# Patient Record
Sex: Female | Born: 1990 | Race: White | Hispanic: No | Marital: Single | Smoking: Never smoker
Health system: Southern US, Community
[De-identification: ages and names within clinical notes are randomized; demographics above are authoritative.]

## PROBLEM LIST (undated history)

## (undated) DIAGNOSIS — I951 Orthostatic hypotension: Secondary | ICD-10-CM

## (undated) DIAGNOSIS — R569 Unspecified convulsions: Secondary | ICD-10-CM

## (undated) DIAGNOSIS — G40802 Other epilepsy, not intractable, without status epilepticus: Secondary | ICD-10-CM

## (undated) DIAGNOSIS — R000 Tachycardia, unspecified: Principal | ICD-10-CM

## (undated) DIAGNOSIS — I498 Other specified cardiac arrhythmias: Secondary | ICD-10-CM

## (undated) DIAGNOSIS — G903 Multi-system degeneration of the autonomic nervous system: Secondary | ICD-10-CM

## (undated) DIAGNOSIS — G90A Postural orthostatic tachycardia syndrome (POTS): Secondary | ICD-10-CM

## (undated) HISTORY — DX: Multi-system degeneration of the autonomic nervous system: G90.3

## (undated) HISTORY — DX: Other epilepsy, not intractable, without status epilepticus: G40.802

## (undated) HISTORY — DX: Orthostatic hypotension: I95.1

## (undated) HISTORY — DX: Postural orthostatic tachycardia syndrome (POTS): G90.A

## (undated) HISTORY — DX: Other specified cardiac arrhythmias: I49.8

## (undated) HISTORY — DX: Unspecified convulsions: R56.9

## (undated) HISTORY — PX: NO PAST SURGERIES: SHX2092

## (undated) HISTORY — DX: Tachycardia, unspecified: R00.0

---

## 2012-10-21 DIAGNOSIS — G40309 Generalized idiopathic epilepsy and epileptic syndromes, not intractable, without status epilepticus: Secondary | ICD-10-CM | POA: Insufficient documentation

## 2012-10-21 DIAGNOSIS — R55 Syncope and collapse: Secondary | ICD-10-CM | POA: Insufficient documentation

## 2012-12-11 ENCOUNTER — Encounter: Payer: Self-pay | Admitting: Neurology

## 2012-12-11 DIAGNOSIS — G90A Postural orthostatic tachycardia syndrome (POTS): Secondary | ICD-10-CM | POA: Insufficient documentation

## 2012-12-11 DIAGNOSIS — F988 Other specified behavioral and emotional disorders with onset usually occurring in childhood and adolescence: Secondary | ICD-10-CM

## 2012-12-11 DIAGNOSIS — G40802 Other epilepsy, not intractable, without status epilepticus: Secondary | ICD-10-CM

## 2012-12-11 DIAGNOSIS — F909 Attention-deficit hyperactivity disorder, unspecified type: Secondary | ICD-10-CM

## 2012-12-11 HISTORY — DX: Other specified behavioral and emotional disorders with onset usually occurring in childhood and adolescence: F98.8

## 2013-04-27 ENCOUNTER — Emergency Department (HOSPITAL_BASED_OUTPATIENT_CLINIC_OR_DEPARTMENT_OTHER)
Admission: EM | Admit: 2013-04-27 | Discharge: 2013-04-27 | Disposition: A | Discharge status: Home / Self Care | Payer: Managed Care, Other (non HMO) | Attending: Emergency Medicine | Admitting: Emergency Medicine

## 2013-04-27 ENCOUNTER — Encounter (HOSPITAL_BASED_OUTPATIENT_CLINIC_OR_DEPARTMENT_OTHER): Payer: Self-pay | Admitting: *Deleted

## 2013-04-27 DIAGNOSIS — S0100XA Unspecified open wound of scalp, initial encounter: Secondary | ICD-10-CM | POA: Insufficient documentation

## 2013-04-27 DIAGNOSIS — W1809XA Striking against other object with subsequent fall, initial encounter: Secondary | ICD-10-CM | POA: Insufficient documentation

## 2013-04-27 DIAGNOSIS — Z8679 Personal history of other diseases of the circulatory system: Secondary | ICD-10-CM | POA: Insufficient documentation

## 2013-04-27 DIAGNOSIS — Y92009 Unspecified place in unspecified non-institutional (private) residence as the place of occurrence of the external cause: Secondary | ICD-10-CM | POA: Insufficient documentation

## 2013-04-27 DIAGNOSIS — Y93K1 Activity, walking an animal: Secondary | ICD-10-CM | POA: Insufficient documentation

## 2013-04-27 DIAGNOSIS — S0101XA Laceration without foreign body of scalp, initial encounter: Secondary | ICD-10-CM

## 2013-04-27 DIAGNOSIS — Z79899 Other long term (current) drug therapy: Secondary | ICD-10-CM | POA: Insufficient documentation

## 2013-04-27 DIAGNOSIS — G40909 Epilepsy, unspecified, not intractable, without status epilepticus: Secondary | ICD-10-CM | POA: Insufficient documentation

## 2013-04-27 NOTE — ED Provider Notes (Signed)
History     CSN: [id number]  Arrival date & time 04/27/13  1057   First MD Initiated Contact with Patient 04/27/13 1159      Chief Complaint  Patient presents with  . Head Laceration    (Consider location/radiation/quality/duration/timing/severity/associated sxs/prior treatment) Patient is a 22 y.o. female presenting with skin laceration. The history is provided by the patient. No language interpreter was used.  Laceration Location:  Head/neck Head/neck laceration location:  Scalp Length (cm):  2 Depth:  Through dermis Pain details:    Quality:  Aching   Severity:  Moderate Foreign body present:  No foreign bodies Worsened by:  Nothing tried Ineffective treatments:  None tried   Past Medical History  Diagnosis Date  . Seizures   . Orthostatic hypotension dysautonomic syndrome     Past Surgical History  Procedure Laterality Date  . No past surgeries      Family History  Problem Relation Age of Onset  . Seizures Mother     History  Substance Use Topics  . Smoking status: Never Smoker   . Smokeless tobacco: Never Used  . Alcohol Use: No    OB History   Grav Para Term Preterm Abortions TAB SAB Ect Mult Living                  Review of Systems  Skin: Positive for wound.  All other systems reviewed and are negative.    Allergies  Dye fdc red and Other  Home Medications   Current Outpatient Rx  Name  Route  Sig  Dispense  Refill  . fludrocortisone (FLORINEF) 0.1 MG tablet   Oral   Take 0.1 mg by mouth as directed.         . lamoTRIgine (LAMICTAL) 100 MG tablet   Oral   Take 100 mg by mouth 3 (three) times daily.         . midodrine (PROAMATINE) 5 MG tablet   Oral   Take 5 mg by mouth 3 (three) times daily.           BP 114/75  Pulse 104  Temp(Src) 97.8 F (36.6 C) (Oral)  Resp 20  Ht 5\' 11"  (1.803 m)  Wt 135 lb (61.236 kg)  BMI 18.84 kg/m2  SpO2 100%  Physical Exam  Nursing note and vitals reviewed. Constitutional: She  appears well-developed and well-nourished.  HENT:  Head: Normocephalic.  Nose: Nose normal.  Mouth/Throat: Oropharynx is clear and moist.  Eyes: Pupils are equal, round, and reactive to light.  Neck: Normal range of motion.  Cardiovascular: Normal rate.   Pulmonary/Chest: Effort normal.  Musculoskeletal: Normal range of motion.  Neurological: She is alert.  Skin: Skin is warm.  Psychiatric: She has a normal mood and affect.    ED Course  LACERATION REPAIR Date/Time: 04/27/2013 12:45 PM Performed by: Elson Areas Authorized by: Elson Areas Consent: Verbal consent obtained. Risks and benefits: risks, benefits and alternatives were discussed Time out: Immediately prior to procedure a "time out" was called to verify the correct patient, procedure, equipment, support staff and site/side marked as required. Body area: head/neck Location details: scalp Laceration length: 2 cm Tendon involvement: none Nerve involvement: none Vascular damage: no Anesthesia: local infiltration Local anesthetic: lidocaine 2% without epinephrine Preparation: Patient was prepped and draped in the usual sterile fashion. Irrigation solution: saline Degree of undermining: none Skin closure: staples Number of sutures: 4 Technique: simple Approximation: close Approximation difficulty: simple Patient tolerance: Patient tolerated the procedure  well with no immediate complications.   (including critical care time)  Labs Reviewed - No data to display No results found.   No diagnosis found.    MDM          Elson Areas, PA-C 04/27/13 1247

## 2013-04-27 NOTE — ED Provider Notes (Signed)
Medical screening examination/treatment/procedure(s) were performed by non-physician practitioner and as supervising physician I was immediately available for consultation/collaboration.   Charles B. Sheldon, MD 04/27/13 1446 

## 2013-04-27 NOTE — ED Notes (Signed)
She was walking the dog, fell in her house and hit the left side of her head on a brick fire place. No LOC. Small scalp laceration and abrasion noted.

## 2013-05-15 ENCOUNTER — Other Ambulatory Visit: Payer: Self-pay

## 2013-05-15 DIAGNOSIS — G40309 Generalized idiopathic epilepsy and epileptic syndromes, not intractable, without status epilepticus: Secondary | ICD-10-CM

## 2013-05-15 MED ORDER — LAMOTRIGINE 100 MG PO TABS: 100.0000 mg | ORAL_TABLET | Freq: Three times a day (TID) | ORAL | Status: DC

## 2013-05-16 ENCOUNTER — Encounter (HOSPITAL_BASED_OUTPATIENT_CLINIC_OR_DEPARTMENT_OTHER): Payer: Self-pay | Admitting: *Deleted

## 2013-11-07 ENCOUNTER — Other Ambulatory Visit: Payer: Self-pay | Admitting: Family

## 2013-11-12 ENCOUNTER — Encounter: Payer: Self-pay | Admitting: Family

## 2013-11-14 ENCOUNTER — Ambulatory Visit: Payer: Self-pay | Admitting: Family

## 2014-01-31 ENCOUNTER — Ambulatory Visit: Payer: Self-pay | Admitting: Family

## 2014-02-06 ENCOUNTER — Ambulatory Visit: Payer: Self-pay | Admitting: Family

## 2014-02-19 ENCOUNTER — Other Ambulatory Visit: Payer: Self-pay | Admitting: Family

## 2014-02-22 ENCOUNTER — Ambulatory Visit (INDEPENDENT_AMBULATORY_CARE_PROVIDER_SITE_OTHER): Payer: Managed Care, Other (non HMO) | Admitting: Family

## 2014-02-22 ENCOUNTER — Encounter: Payer: Self-pay | Admitting: Family

## 2014-02-22 VITALS — BP 114/70 | HR 78 | Ht 70.0 in | Wt 147.8 lb

## 2014-02-22 DIAGNOSIS — I951 Orthostatic hypotension: Principal | ICD-10-CM

## 2014-02-22 DIAGNOSIS — G90A Postural orthostatic tachycardia syndrome (POTS): Secondary | ICD-10-CM

## 2014-02-22 DIAGNOSIS — G40802 Other epilepsy, not intractable, without status epilepticus: Secondary | ICD-10-CM

## 2014-02-22 DIAGNOSIS — G40309 Generalized idiopathic epilepsy and epileptic syndromes, not intractable, without status epilepticus: Secondary | ICD-10-CM

## 2014-02-22 DIAGNOSIS — I498 Other specified cardiac arrhythmias: Secondary | ICD-10-CM

## 2014-02-22 DIAGNOSIS — R000 Tachycardia, unspecified: Principal | ICD-10-CM

## 2014-02-22 NOTE — Patient Instructions (Signed)
Continue your Lamotrigine without change. Call me if you have any questions or concerns about your condition.  Please plan to return for follow up in 1 year or sooner if needed.

## 2014-02-22 NOTE — Progress Notes (Signed)
Patient: Jennifer Galloway MRN: [id number] Sex: female DOB: Feb 23, 1991  Provider: Elveria RisingGOODPASTURE, Johan Creveling, NP [facility] Care[facility]  Note type: Routine return visit  History of Present Illness: Referral Source: Dr. Rosiland OzScott Buck History from: patient Chief Complaint: POTS  Jennifer Gandylizabeth Huston Jennifer Galloway is a 23 y.o. 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 Florinef and Midodrine, along with adequate hydration has controlled the POTS symptoms fairly well. She had flare up with hypotension in December 2014 and was [facility] by Dr Ace GinsBuck for management of her condition.   Jennifer Galloway also has a photic sensitive seizure disorder. EEG has shown evidence of photo myoclonic or photo convulsive responses are generalized spike and slow wave activity associated with photic stimulation, but she has also had clinical seizures. The last occurred on November 02, 2009. Jennifer Galloway has been seizure free on Lamotrigine since that time. EEG June 03, 2006 showed polyspike and slow-wave discharges with photic stimulation. This was similar to EEG done August 09, 2002 which showed 3-1/2-4-1/2 Hz spike and slow wave complexes during photic stimulation. She drives without restrictions and is not interested in coming off medication, so repeat EEG's for the purpose of tapering medication have not been performed.  Review of Systems: 12 system review was unremarkable  Past Medical History  Diagnosis Date  . POTS (postural orthostatic tachycardia syndrome)   . Photosensitive reflex epilepsy   . ADHD (attention deficit hyperactivity disorder)   . Seizures   . Orthostatic hypotension dysautonomic syndrome    Hospitalizations: no, Head Injury: no, Nervous System Infections: no, Immunizations up to date: yes Past Medical History Comments: Jennifer Galloway has Postural Orthostatic Tachycardia  Syndrome. She has had photic sensitive  seizures and has an abnormal EEG that shows evidence of photic sensitivity. She had problems with hypotension in December 2014 and [facility]She had loop recorder implanted and medications adjusted.   Surgical History Past Surgical History  Procedure Laterality Date  . No past surgeries      Family History family history includes Cancer in her paternal grandfather; Seizures in her mother; Thyroid disease in her mother. Family History is otherwise negative for migraines, seizures, cognitive impairment, blindness, deafness, birth defects, chromosomal disorder, autism.  Social History History   Social History  . Marital Status: Single    Spouse Name: N/A    Number of Children: N/A  . Years of Education: N/A   Social History Main Topics  . Smoking status: Never Smoker   . Smokeless tobacco: Never Used  . Alcohol Use: No  . Drug Use: No  . Sexual Activity: No   Other Topics Concern  . None   Social History Narrative   ** Merged History Encounter **       Educational level: junior college School Attending: [employer] Living with:  parents and sister [place], while she and her sister are in college in [place].  Hobbies/Interest: Enjoys horseback riding School comments:  Jennifer Galloway is attending [employer] in pursuit of an associate degree in the arts and plans to transfer this fall to [employer] to get a Bachelor's degree in Business Management. She graduated from [employer] in 2011.  Physical Exam BP 114/70  Pulse 78  Ht 5\' 10"  (1.778 m)  Wt 147 lb 12.8 oz (67.042 kg)  BMI 21.21 kg/m2  LMP 02/20/2014 General: well developed, well nourished young woman, seated in exam room, in no evident distress Head:  head normocephalic and atraumatic.  Oropharynx benign. Neck: supple with no carotid or supraclavicular bruits Cardiovascular: regular rate and rhythm, no murmurs Skin: No rashes or lesions  Neurologic Exam Mental Status: Awake and fully  alert.  Oriented to place and time.  Recent and remote memory intact.  Attention span, concentration, and fund of knowledge appropriate.  Mood and affect appropriate. Cranial Nerves: Fundoscopic exam revels sharp disc margins.  Pupils equal, briskly reactive to light.  Extraocular movements full without nystagmus.  Visual fields full to confrontation.  Hearing intact and symmetric to finger rub.  Facial sensation intact.  Face tongue, palate move normally and symmetrically.  Neck flexion and extension normal. Motor: Normal bulk and tone. Normal strength in all tested extremity muscles. Sensory: Intact to touch and temperature in all extremities.  Coordination: Rapid alternating movements normal in all extremities.  Finger-to-nose and heel-to shin performed accurately bilaterally.  Romberg negative. Gait and Station: Arises from chair without difficulty.  Stance is normal. Gait demonstrates normal stride length and balance.   Able to heel, toe and tandem walk without difficulty. Reflexes: diminished and symmetric. Toes downgoing.  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 She has been seizure free on Lamotrigine since December, 2010. Jennifer Galloway drives without restrictions and is not interested in tapering off medication. I told Jennifer Galloway that I will continue to see her in this practice while she is in college and then will help her to transition to adult neurology care when she graduates from college, as [place] to [place] closer to family. She will continue her medications without change for now and will return for follow up in 1 year or sooner if needed.

## 2014-02-25 ENCOUNTER — Other Ambulatory Visit: Payer: Self-pay | Admitting: Family

## 2014-04-01 ENCOUNTER — Other Ambulatory Visit: Payer: Self-pay | Admitting: Family

## 2014-07-30 DIAGNOSIS — N926 Irregular menstruation, unspecified: Secondary | ICD-10-CM | POA: Insufficient documentation

## 2014-12-02 ENCOUNTER — Encounter (HOSPITAL_COMMUNITY): Payer: Self-pay | Admitting: Emergency Medicine

## 2014-12-02 ENCOUNTER — Emergency Department (HOSPITAL_COMMUNITY)
Admission: EM | Admit: 2014-12-02 | Discharge: 2014-12-02 | Disposition: A | Discharge status: Home / Self Care | Payer: Managed Care, Other (non HMO) | Attending: Emergency Medicine | Admitting: Emergency Medicine

## 2014-12-02 ENCOUNTER — Emergency Department (HOSPITAL_COMMUNITY): Payer: Managed Care, Other (non HMO)

## 2014-12-02 DIAGNOSIS — Z79899 Other long term (current) drug therapy: Secondary | ICD-10-CM | POA: Diagnosis not present

## 2014-12-02 DIAGNOSIS — Y9389 Activity, other specified: Secondary | ICD-10-CM | POA: Insufficient documentation

## 2014-12-02 DIAGNOSIS — R000 Tachycardia, unspecified: Secondary | ICD-10-CM | POA: Insufficient documentation

## 2014-12-02 DIAGNOSIS — Y998 Other external cause status: Secondary | ICD-10-CM | POA: Insufficient documentation

## 2014-12-02 DIAGNOSIS — Z3202 Encounter for pregnancy test, result negative: Secondary | ICD-10-CM | POA: Diagnosis not present

## 2014-12-02 DIAGNOSIS — R55 Syncope and collapse: Secondary | ICD-10-CM

## 2014-12-02 DIAGNOSIS — I951 Orthostatic hypotension: Secondary | ICD-10-CM | POA: Diagnosis not present

## 2014-12-02 DIAGNOSIS — Y9289 Other specified places as the place of occurrence of the external cause: Secondary | ICD-10-CM | POA: Diagnosis not present

## 2014-12-02 DIAGNOSIS — W08XXXA Fall from other furniture, initial encounter: Secondary | ICD-10-CM | POA: Insufficient documentation

## 2014-12-02 DIAGNOSIS — E870 Hyperosmolality and hypernatremia: Secondary | ICD-10-CM | POA: Diagnosis not present

## 2014-12-02 DIAGNOSIS — S0990XA Unspecified injury of head, initial encounter: Secondary | ICD-10-CM | POA: Diagnosis present

## 2014-12-02 DIAGNOSIS — G90A Postural orthostatic tachycardia syndrome (POTS): Secondary | ICD-10-CM

## 2014-12-02 DIAGNOSIS — S0003XA Contusion of scalp, initial encounter: Secondary | ICD-10-CM

## 2014-12-02 DIAGNOSIS — G40909 Epilepsy, unspecified, not intractable, without status epilepticus: Secondary | ICD-10-CM | POA: Insufficient documentation

## 2014-12-02 DIAGNOSIS — Z7952 Long term (current) use of systemic steroids: Secondary | ICD-10-CM | POA: Insufficient documentation

## 2014-12-02 LAB — CBC WITH DIFFERENTIAL/PLATELET
Basophils Absolute: 0 10*3/uL (ref 0.0–0.1)
Basophils Relative: 0 % (ref 0–1)
EOS ABS: 0 10*3/uL (ref 0.0–0.7)
EOS PCT: 0 % (ref 0–5)
HCT: 40.5 % (ref 36.0–46.0)
Hemoglobin: 13 g/dL (ref 12.0–15.0)
LYMPHS ABS: 1.6 10*3/uL (ref 0.7–4.0)
Lymphocytes Relative: 20 % (ref 12–46)
MCH: 26.8 pg (ref 26.0–34.0)
MCHC: 32.1 g/dL (ref 30.0–36.0)
MCV: 83.5 fL (ref 78.0–100.0)
Monocytes Absolute: 0.8 10*3/uL (ref 0.1–1.0)
Monocytes Relative: 10 % (ref 3–12)
NEUTROS PCT: 70 % (ref 43–77)
Neutro Abs: 5.6 10*3/uL (ref 1.7–7.7)
PLATELETS: 299 10*3/uL (ref 150–400)
RBC: 4.85 MIL/uL (ref 3.87–5.11)
RDW: 14.4 % (ref 11.5–15.5)
WBC: 8.1 10*3/uL (ref 4.0–10.5)

## 2014-12-02 LAB — BASIC METABOLIC PANEL
Anion gap: 9 (ref 5–15)
BUN: 13 mg/dL (ref 6–23)
CALCIUM: 9.7 mg/dL (ref 8.4–10.5)
CO2: 26 mmol/L (ref 19–32)
Chloride: 112 mmol/L (ref 96–112)
Creatinine, Ser: 0.97 mg/dL (ref 0.50–1.10)
GFR calc Af Amer: >90 mL/min (ref >90)
GFR calc non Af Amer: 82 mL/min — ABNORMAL LOW (ref >90)
Glucose, Bld: 103 mg/dL — ABNORMAL HIGH (ref 70–99)
Potassium: 4 mmol/L (ref 3.5–5.1)
Sodium: 147 mmol/L — ABNORMAL HIGH (ref 135–145)

## 2014-12-02 LAB — URINALYSIS, ROUTINE W REFLEX MICROSCOPIC
Bilirubin Urine: NEGATIVE
Glucose, UA: NEGATIVE mg/dL
Hgb urine dipstick: NEGATIVE
Ketones, ur: NEGATIVE mg/dL
Nitrite: NEGATIVE
PROTEIN: NEGATIVE mg/dL
Specific Gravity, Urine: 1.017 (ref 1.005–1.030)
UROBILINOGEN UA: 0.2 mg/dL (ref 0.0–1.0)
pH: 7 (ref 5.0–8.0)

## 2014-12-02 LAB — URINE MICROSCOPIC-ADD ON

## 2014-12-02 LAB — POC URINE PREG, ED: Preg Test, Ur: NEGATIVE

## 2014-12-02 MED ORDER — SODIUM CHLORIDE 0.9 % IV BOLUS (SEPSIS)
1000.0000 mL | Freq: Once | INTRAVENOUS | Status: AC
  Administered 2014-12-02: 1000 mL via INTRAVENOUS

## 2014-12-02 NOTE — ED Notes (Signed)
Brought in by EMS from [facility] with c/o seizures.  Pt was sitting in her class when she had "seizure episode", passed out and fell, hitting head.  It was reported that she "was out for a minute".  Pt presents to ED A/Ox4, fully awake.  Pt sustained a large hematoma on the back of her head, with complaint of headache--- no other complaints.

## 2014-12-02 NOTE — ED Notes (Signed)
Bed: WA11 Expected date:  Expected time:  Means of arrival:  Comments: EMS-seizure 

## 2014-12-02 NOTE — ED Provider Notes (Signed)
CSN: [id number]     Arrival date & time 12/02/14  1649 History   First MD Initiated Contact with Patient 12/02/14 1728     Chief Complaint  Patient presents with  . Seizures  . Head Injury     (Consider location/radiation/quality/duration/timing/severity/associated sxs/prior Treatment) HPI Comments: Jennifer Galloway is a 25 y.o. female with a PMHx of POTS, photosensitive reflex epilepsy, orthostatic hypotension dysautonomic syndrome, and ADHD, who presents to the ED with complaints of syncopal episode that occurred while she was sitting down on a stool during class, causing her to fall back from seated height onto a hard floor and hit her head. She states that syncopal episode lasted less than 5 minutes, and she awoke once the EMS had arrived. States she doesn't have a seizure disorder, only POTS, and denies that this was a seizure. She states she has an implanted cardiac monitor but states it doesn't defibrillate, only warns her of tachycardia. Baseline HR is 120s. Has had syncopal episodes similar to this since 4th grade. Takes lamictal but states she doesn't have seizures. Denies any blood thinning medications. Currently she's complaining of scalp pain and associated hematoma left parietal area, stating that the pain is 6/10 nonradiating throbbing constant pain with no aggravating factors, and alleviated with ice. She denies any prodromal aura/symptoms, fevers, chills, chest pain, shortness of breath, abdominal pain, nausea, vomiting, diarrhea, cause patient, dysuria, hematuria, vaginal bleeding or discharge, tinnitus, vision changes, headache, back or neck pain, dizziness, numbness, weakness, or tingling. She denies any loss of bowel or bladder function, and denies any post ictal confusion. Last menstrual was 10/15/14, states that she has irregular menses and started Depo-Provera, next injection due on February 23.  Patient is a 24 y.o. female presenting with head injury and syncope. The  history is provided by the patient. No language interpreter was used.  Head Injury Location:  L parietal Time since incident:  45 minutes Mechanism of injury: fall   Pain details:    Quality:  Throbbing   Severity:  Moderate (6/10)   Duration:  45 minutes   Timing:  Constant   Progression:  Unchanged Chronicity:  New Relieved by:  Ice Worsened by:  Nothing tried Ineffective treatments:  None tried Associated symptoms: loss of consciousness   Associated symptoms: no blurred vision, no difficulty breathing, no disorientation, no double vision, no focal weakness, no headaches, no hearing loss, no memory loss, no nausea, no neck pain, no numbness, no seizures, no tinnitus and no vomiting   Loss of Consciousness Episode history:  Single Most recent episode:  Today Duration: "a few minutes, <5 minutes" Timing:  Intermittent Progression:  Resolved Chronicity:  Chronic Context: normal activity   Witnessed: yes   Relieved by:  None tried Worsened by:  Nothing tried Ineffective treatments:  None tried Associated symptoms: no anxiety, no chest pain, no confusion, no diaphoresis, no difficulty breathing, no dizziness, no fever, no focal sensory loss, no focal weakness, no headaches, no malaise/fatigue, no nausea, no palpitations, no recent fall, no recent injury, no recent surgery, no rectal bleeding, no seizures, no shortness of breath, no visual change, no vomiting and no weakness   Risk factors: no seizures     Past Medical History  Diagnosis Date  . POTS (postural orthostatic tachycardia syndrome)   . Photosensitive reflex epilepsy   . ADHD (attention deficit hyperactivity disorder)   . Seizures   . Orthostatic hypotension dysautonomic syndrome    Past Surgical History  Procedure Laterality Date  .  No past surgeries     Family History  Problem Relation Age of Onset  . Thyroid disease Mother   . Seizures Mother   . Cancer Paternal Grandfather     Died at 5078   History   Substance Use Topics  . Smoking status: Never Smoker   . Smokeless tobacco: Never Used  . Alcohol Use: No   OB History    No data available     Review of Systems  Constitutional: Negative for fever, chills, malaise/fatigue and diaphoresis.  HENT: Negative for hearing loss and tinnitus.        +scalp hematoma/pain  Eyes: Negative for blurred vision, double vision, photophobia and visual disturbance.  Respiratory: Negative for shortness of breath.   Cardiovascular: Positive for syncope. Negative for chest pain and palpitations.  Gastrointestinal: Negative for nausea, vomiting, diarrhea, constipation and blood in stool.  Genitourinary: Negative for dysuria, frequency, hematuria, vaginal bleeding, vaginal discharge and menstrual problem.  Musculoskeletal: Negative for myalgias, back pain, arthralgias and neck pain.  Skin: Negative for wound.  Neurological: Positive for loss of consciousness and syncope. Negative for dizziness, tremors, focal weakness, seizures, facial asymmetry, weakness, light-headedness, numbness and headaches.  Psychiatric/Behavioral: Negative for memory loss and confusion.   10 Systems reviewed and are negative for acute change except as noted in the HPI.    Allergies  Dye fdc red and Other  Home Medications   Prior to Admission medications   Medication Sig Start Date End Date Taking? Authorizing Provider  fludrocortisone (FLORINEF) 0.1 MG tablet Take 0.1 mg by mouth 2 (two) times daily.    Yes Historical Provider, MD  lamoTRIgine (LAMICTAL) 100 MG tablet Take 100 mg by mouth 2 (two) times daily.   Yes Historical Provider, MD  midodrine (PROAMATINE) 10 MG tablet Take 10 mg by mouth 3 (three) times daily.   Yes Historical Provider, MD  lamoTRIgine (LAMICTAL) 100 MG tablet TAKE 1 TABLET (100 MG TOTAL) BY MOUTH 3 (THREE) TIMES DAILY. Patient not taking: Reported on 12/02/2014    Elveria Risingina Goodpasture, NP  lamoTRIgine (LAMICTAL) 100 MG tablet TAKE 1 TABLET (100 MG  TOTAL) BY MOUTH 3 (THREE) TIMES DAILY. Patient not taking: Reported on 12/02/2014    Elveria Risingina Goodpasture, NP   BP 126/84 mmHg  Pulse 107  Temp(Src) 98.2 F (36.8 C) (Oral)  Resp 20  SpO2 100% Physical Exam  Constitutional: She is oriented to person, place, and time. She appears well-developed and well-nourished.  Non-toxic appearance. No distress.  Afebrile, nontoxic, NAD, with mild tachycardia which pt states is her baseline. Otherwise VSS.  HENT:  Head: Normocephalic. Head is with contusion. Head is without raccoon's eyes, without Battle's sign and without laceration.  Mouth/Throat: Oropharynx is clear and moist. Mucous membranes are dry.  Hematoma to L posterior scalp which is TTP without bony crepitus or step offs, no lacerations, neg raccoon's eyes or battle's sign.  Mildly dry mucous membranes  Eyes: Conjunctivae and EOM are normal. Pupils are equal, round, and reactive to light. Right eye exhibits no discharge. Left eye exhibits no discharge.  PERRL, EOMI without nystagmus  Neck: Normal range of motion. Neck supple. No spinous process tenderness and no muscular tenderness present. No rigidity. Normal range of motion present.  FROM intact without spinous process or paraspinous muscle TTP, no bony stepoffs or deformities, no muscle spasms. No rigidity or meningeal signs. No bruising or swelling.   Cardiovascular: Regular rhythm, normal heart sounds and intact distal pulses.  Tachycardia present.  Exam reveals no  gallop and no friction rub.   No murmur heard. Mildly tachycardic in the low 100s-110s, which pt states is baseline. Reg rhythm, nl s1/s2, no m/r/g, distal pulses intact, no pedal edema  Pulmonary/Chest: Effort normal and breath sounds normal. No respiratory distress. She has no decreased breath sounds. She has no wheezes. She has no rhonchi. She has no rales.  Abdominal: Soft. Normal appearance and bowel sounds are normal. She exhibits no distension. There is no tenderness. There  is no rigidity, no rebound, no guarding and no CVA tenderness.  Musculoskeletal: Normal range of motion.  All spinal levels nonTTP without bony stepoffs or deformities.  MAE x4 Strength 5/5 in all extremities Sensation grossly intact in all extremities  Neurological: She is alert and oriented to person, place, and time. She has normal strength. No cranial nerve deficit or sensory deficit. Coordination normal. GCS eye subscore is 4. GCS verbal subscore is 5. GCS motor subscore is 6.  CN 2-12 grossly intact A&O x4 GCS 15 Sensation and strength intact Coordination with finger-to-nose WNL Neg pronator drift   Skin: Skin is warm, dry and intact. No rash noted.  No abrasions or lacerations L scalp hematoma as noted above  Psychiatric: She has a normal mood and affect.  Nursing note and vitals reviewed.   ED Course  Procedures (including critical care time) 19:32 Orthostatic Vital Signs JH  Orthostatic Lying  - BP- Lying: 121/82 mmHg ; Pulse- Lying: 90  Orthostatic Sitting - BP- Sitting: 139/79 mmHg ; Pulse- Sitting: 100  Orthostatic Standing at 0 minutes - BP- Standing at 0 minutes: 133/80 mmHg ; Pulse- Standing at 0 minutes: 101    Labs Review Labs Reviewed  BASIC METABOLIC PANEL - Abnormal; Notable for the following:    Sodium 147 (*)    Glucose, Bld 103 (*)    GFR calc non Af Amer 82 (*)    All other components within normal limits  URINALYSIS, ROUTINE W REFLEX MICROSCOPIC - Abnormal; Notable for the following:    Leukocytes, UA TRACE (*)    All other components within normal limits  CBC WITH DIFFERENTIAL/PLATELET  URINE MICROSCOPIC-ADD ON  LAMOTRIGINE LEVEL  POC URINE PREG, ED    Imaging Review Ct Head Wo Contrast  12/02/2014   CLINICAL DATA:  24 year old female with syncopal episode today complicated by a fall with injury to the back of the head. Swelling in the occipital region. Abrasion to the scan.  EXAM: CT HEAD WITHOUT CONTRAST  CT CERVICAL SPINE WITHOUT CONTRAST   TECHNIQUE: Multidetector CT imaging of the head and cervical spine was performed following the standard protocol without intravenous contrast. Multiplanar CT image reconstructions of the cervical spine were also generated.  COMPARISON:  Head CT 11/01/2008.  FINDINGS: CT HEAD FINDINGS  Extensive soft tissue swelling in the left parieto-occipital scalp, compatible with a contusion and small hematoma. No acute displaced skull fractures are identified. No acute intracranial abnormality. Specifically, no evidence of acute post-traumatic intracranial hemorrhage, no definite regions of acute/subacute cerebral ischemia, no focal mass, mass effect, hydrocephalus or abnormal intra or extra-axial fluid collections. The visualized paranasal sinuses and mastoids are well pneumatized.  CT CERVICAL SPINE FINDINGS  Reversal of normal cervical lordosis centered at the level of C6, presumably positional. Alignment is otherwise anatomic. No acute displaced fractures are noted. Prevertebral soft tissues are normal. Mild multilevel degenerative disc disease, most severe at C4-C5. Visualized portions of the upper thorax are unremarkable.  IMPRESSION: 1. Soft tissue contusion and small scalp hematoma in  the left parieto-occipital region. No underlying displaced skull fracture identified. 2. No signs of significant acute intracranial trauma. The appearance of the brain is normal. 3. No evidence of acute traumatic injury to the cervical spine.   Electronically Signed   By: Trudie Reed M.D.   On: 12/02/2014 18:54   Ct Cervical Spine Wo Contrast  12/02/2014   CLINICAL DATA:  24 year old female with syncopal episode today complicated by a fall with injury to the back of the head. Swelling in the occipital region. Abrasion to the scan.  EXAM: CT HEAD WITHOUT CONTRAST  CT CERVICAL SPINE WITHOUT CONTRAST  TECHNIQUE: Multidetector CT imaging of the head and cervical spine was performed following the standard protocol without intravenous  contrast. Multiplanar CT image reconstructions of the cervical spine were also generated.  COMPARISON:  Head CT 11/01/2008.  FINDINGS: CT HEAD FINDINGS  Extensive soft tissue swelling in the left parieto-occipital scalp, compatible with a contusion and small hematoma. No acute displaced skull fractures are identified. No acute intracranial abnormality. Specifically, no evidence of acute post-traumatic intracranial hemorrhage, no definite regions of acute/subacute cerebral ischemia, no focal mass, mass effect, hydrocephalus or abnormal intra or extra-axial fluid collections. The visualized paranasal sinuses and mastoids are well pneumatized.  CT CERVICAL SPINE FINDINGS  Reversal of normal cervical lordosis centered at the level of C6, presumably positional. Alignment is otherwise anatomic. No acute displaced fractures are noted. Prevertebral soft tissues are normal. Mild multilevel degenerative disc disease, most severe at C4-C5. Visualized portions of the upper thorax are unremarkable.  IMPRESSION: 1. Soft tissue contusion and small scalp hematoma in the left parieto-occipital region. No underlying displaced skull fracture identified. 2. No signs of significant acute intracranial trauma. The appearance of the brain is normal. 3. No evidence of acute traumatic injury to the cervical spine.   Electronically Signed   By: Trudie Reed M.D.   On: 12/02/2014 18:54     EKG Interpretation   Date/Time:  Monday December 02 2014 18:40:41 EST Ventricular Rate:  103 PR Interval:  138 QRS Duration: 88 QT Interval:  338 QTC Calculation: 442 R Axis:   92 Text Interpretation:  Sinus tachycardia Probable left atrial enlargement  Consider right ventricular hypertrophy No old tracing to compare Confirmed  by [facility]  MD, MARTHA [phone]) on 12/02/2014 6:48:41 PM      MDM   Final diagnoses:  Head injury  POTS (postural orthostatic tachycardia syndrome)  Syncope and collapse  Scalp hematoma, initial encounter   Essential hypernatremia    24 y.o. female with hx of syncope/POTS since 4th grade, had syncopal episode today. Denies that it was a seizure, and states she doesn't have a seizure d/o but takes lamictal. +Head inj with +LOC and significant hematoma, no scalp crepitus. Will obtain imaging, basic labs, EKG, and give fluids. Pt is tachycardic which she states is persistent. Has an implanted monitor but denies that it's a defibrillator. Nonfocal neuro exam. Pt denies wanting pain medications at this time. Will reassess shortly.   8:29 PM Upreg neg. U/A with trace leuks but no UTI. CBC w/diff WNL. BMP pending. Lamotrigine level pending. Orthostatic VS showing some slight increase in HR with standing c/w prior hx of POTS. CT head/neck showing soft tissue contusion without underlying scalp fracture or intracranial trauma. Pt's HR improved with fluids, now 94bpm. Will reassess shortly.  9:50 PM BMP resulting, delay with lab machines, shows sodium of 147 but pt was given fluids and doubt need for further intervention. Pt still without  headache or any other neuro changes. Will have her see her PCP in 3 days for recheck. Discussed mental rest for concussive symptoms, and gradual return to activity as tolerated after HA subsides (in the event she has a HA). Discussed tylenol/motrin for pain. I explained the diagnosis and have given explicit precautions to return to the ER including for any other new or worsening symptoms. The patient understands and accepts the medical plan as it's been dictated and I have answered their questions. Discharge instructions concerning home care and prescriptions have been given. The patient is STABLE and is discharged to home in good condition.  BP 126/72 mmHg  Pulse 94  Temp(Src) 98.2 F (36.8 C) (Oral)  Resp 18  SpO2 98%  LMP 10/15/2014  Meds ordered this encounter  Medications  . sodium chloride 0.9 % bolus 1,000 mL    Sig:      Jennifer [place],  PA-C 12/02/14 2154  Tilden Fossa, MD 12/02/14 2330

## 2014-12-02 NOTE — Progress Notes (Signed)
EDCM spoke to patient at bedside.  Patient reports her pcp is Dr. Riley NearingAguiar.  Patient also reports her cardiologist is Dr. Rosiland OzScott Buck.  System updated.  No further EDCM needs at this time.

## 2014-12-02 NOTE — ED Notes (Signed)
Patient transported to CT 

## 2014-12-02 NOTE — ED Notes (Signed)
Patient stated she did not have a seizuire. Said she has POTS syndrome.

## 2014-12-02 NOTE — Discharge Instructions (Signed)
Use Ibuprofen or Tylenol for pain. Get plenty of rest, use ice on your head.  Stay in a quiet, not simulating, dark environment. No TV, computer use, video games until headache is resolved completely. No contact sports until cleared by your regular doctor. Follow Up with primary care physician in 3-4 days if headache persists.  Return to the emergency department if patient becomes lethargic, begins vomiting or other change in mental status. Continue your normal home medications.    Head Injury You have a head injury. Headaches and throwing up (vomiting) are common after a head injury. It should be easy to wake up from sleeping. Sometimes you must stay in the hospital. Most problems happen within the first 24 hours. Side effects may occur up to 7-10 days after the injury.  WHAT ARE THE TYPES OF HEAD INJURIES? Head injuries can be as minor as a bump. Some head injuries can be more severe. More severe head injuries include:  A jarring injury to the brain (concussion).  A bruise of the brain (contusion). This mean there is bleeding in the brain that can cause swelling.  A cracked skull (skull fracture).  Bleeding in the brain that collects, clots, and forms a bump (hematoma). WHEN SHOULD I GET HELP RIGHT AWAY?   You are confused or sleepy.  You cannot be woken up.  You feel sick to your stomach (nauseous) or keep throwing up (vomiting).  Your dizziness or unsteadiness is getting worse.  You have very bad, lasting headaches that are not helped by medicine. Take medicines only as told by your doctor.  You cannot use your arms or legs like normal.  You cannot walk.  You notice changes in the black spots in the center of the colored part of your eye (pupil).  You have clear or bloody fluid coming from your nose or ears.  You have trouble seeing. During the next 24 hours after the injury, you must stay with someone who can watch you. This person should get help right away (call 911 in the  [place] if you start to shake and are not able to control it (have seizures), you pass out, or you are unable to wake up. HOW CAN I PREVENT A HEAD INJURY IN THE FUTURE?  Wear seat belts.  Wear a helmet while bike riding and playing sports like football.  Stay away from dangerous activities around the house. WHEN CAN I RETURN TO NORMAL ACTIVITIES AND ATHLETICS? See your doctor before doing these activities. You should not do normal activities or play contact sports until 1 week after the following symptoms have stopped:  Headache that does not go away.  Dizziness.  Poor attention.  Confusion.  Memory problems.  Sickness to your stomach or throwing up.  Tiredness.  Fussiness.  Bothered by bright lights or loud noises.  Anxiousness or depression.  Restless sleep. MAKE SURE YOU:   Understand these instructions.  Will watch your condition.  Will get help right away if you are not doing well or get worse. Document Released: 10/07/2008 Document Revised: 03/11/2014 Document Reviewed: 07/02/2013 [facility] Patient Information 2015 [place], [place]. This information is not intended to replace advice given to you by your health care provider. Make sure you discuss any questions you have with your health care provider.  Facial or Scalp Contusion A facial or scalp contusion is a deep bruise on the face or head. Injuries to the face and head generally cause a lot of swelling, especially around the eyes. Contusions are  the result of an injury that caused bleeding under the skin. The contusion may turn blue, purple, or yellow. Minor injuries will give you a painless contusion, but more severe contusions may stay painful and swollen for a few weeks.  CAUSES  A facial or scalp contusion is caused by a blunt injury or trauma to the face or head area.  SIGNS AND SYMPTOMS   Swelling of the injured area.   Discoloration of the injured area.   Tenderness, soreness, or pain in the injured  area.  DIAGNOSIS  The diagnosis can be made by taking a medical history and doing a physical exam. An X-ray exam, CT scan, or MRI may be needed to determine if there are any associated injuries, such as broken bones (fractures). TREATMENT  Often, the best treatment for a facial or scalp contusion is applying cold compresses to the injured area. Over-the-counter medicines may also be recommended for pain control.  HOME CARE INSTRUCTIONS   Only take over-the-counter or prescription medicines as directed by your health care provider.   Apply ice to the injured area.   Put ice in a plastic bag.   Place a towel between your skin and the bag.   Leave the ice on for 20 minutes, 2-3 times a day.  SEEK MEDICAL CARE IF:  You have bite problems.   You have pain with chewing.   You are concerned about facial defects. SEEK IMMEDIATE MEDICAL CARE IF:  You have severe pain or a headache that is not relieved by medicine.   You have unusual sleepiness, confusion, or personality changes.   You throw up (vomit).   You have a persistent nosebleed.   You have double vision or blurred vision.   You have fluid drainage from your nose or ear.   You have difficulty walking or using your arms or legs.  MAKE SURE YOU:   Understand these instructions.  Will watch your condition.  Will get help right away if you are not doing well or get worse. Document Released: 12/02/2004 Document Revised: 08/15/2013 Document Reviewed: 06/07/2013 [facility] Patient Information 2015 [place], [place]. This information is not intended to replace advice given to you by your health care provider. Make sure you discuss any questions you have with your health care provider.  Cryotherapy Cryotherapy means treatment with cold. Ice or gel packs can be used to reduce both pain and swelling. Ice is the most helpful within the first 24 to 48 hours after an injury or flare-up from overusing a muscle or joint.  Sprains, strains, spasms, burning pain, shooting pain, and aches can all be eased with ice. Ice can also be used when recovering from surgery. Ice is effective, has very few side effects, and is safe for most people to use. PRECAUTIONS  Ice is not a safe treatment option for people with:  Raynaud phenomenon. This is a condition affecting small blood vessels in the extremities. Exposure to cold may cause your problems to return.  Cold hypersensitivity. There are many forms of cold hypersensitivity, including:  Cold urticaria. Red, itchy hives appear on the skin when the tissues begin to warm after being iced.  Cold erythema. This is a red, itchy rash caused by exposure to cold.  Cold hemoglobinuria. Red blood cells break down when the tissues begin to warm after being iced. The hemoglobin that carry oxygen are passed into the urine because they cannot combine with blood proteins fast enough.  Numbness or altered sensitivity in the area being  iced. If you have any of the following conditions, do not use ice until you have discussed cryotherapy with your caregiver:  Heart conditions, such as arrhythmia, angina, or chronic heart disease.  High blood pressure.  Healing wounds or open skin in the area being iced.  Current infections.  Rheumatoid arthritis.  Poor circulation.  Diabetes. Ice slows the blood flow in the region it is applied. This is beneficial when trying to stop inflamed tissues from spreading irritating chemicals to surrounding tissues. However, if you expose your skin to cold temperatures for too long or without the proper protection, you can damage your skin or nerves. Watch for signs of skin damage due to cold. HOME CARE INSTRUCTIONS Follow these tips to use ice and cold packs safely.  Place a dry or damp towel between the ice and skin. A damp towel will cool the skin more quickly, so you may need to shorten the time that the ice is used.  For a more rapid response,  add gentle compression to the ice.  Ice for no more than 10 to 20 minutes at a time. The bonier the area you are icing, the less time it will take to get the benefits of ice.  Check your skin after 5 minutes to make sure there are no signs of a poor response to cold or skin damage.  Rest 20 minutes or more between uses.  Once your skin is numb, you can end your treatment. You can test numbness by very lightly touching your skin. The touch should be so light that you do not see the skin dimple from the pressure of your fingertip. When using ice, most people will feel these normal sensations in this order: cold, burning, aching, and numbness.  Do not use ice on someone who cannot communicate their responses to pain, such as small children or people with dementia. HOW TO MAKE AN ICE PACK Ice packs are the most common way to use ice therapy. Other methods include ice massage, ice baths, and cryosprays. Muscle creams that cause a cold, tingly feeling do not offer the same benefits that ice offers and should not be used as a substitute unless recommended by your caregiver. To make an ice pack, do one of the following:  Place crushed ice or a bag of frozen vegetables in a sealable plastic bag. Squeeze out the excess air. Place this bag inside another plastic bag. Slide the bag into a pillowcase or place a damp towel between your skin and the bag.  Mix 3 parts water with 1 part rubbing alcohol. Freeze the mixture in a sealable plastic bag. When you remove the mixture from the freezer, it will be slushy. Squeeze out the excess air. Place this bag inside another plastic bag. Slide the bag into a pillowcase or place a damp towel between your skin and the bag. SEEK MEDICAL CARE IF:  You develop white spots on your skin. This may give the skin a blotchy (mottled) appearance.  Your skin turns blue or pale.  Your skin becomes waxy or hard.  Your swelling gets worse. MAKE SURE YOU:   Understand these  instructions.  Will watch your condition.  Will get help right away if you are not doing well or get worse. Document Released: 06/21/2011 Document Revised: 03/11/2014 Document Reviewed: 06/21/2011 [facility] Patient Information 2015 [place], [place]. This information is not intended to replace advice given to you by your health care provider. Make sure you discuss any questions you have with  your health care provider.  Concussion A concussion, or closed-head injury, is a brain injury caused by a direct blow to the head or by a quick and sudden movement (jolt) of the head or neck. Concussions are usually not life-threatening. Even so, the effects of a concussion can be serious. If you have had a concussion before, you are more likely to experience concussion-like symptoms after a direct blow to the head.  CAUSES  Direct blow to the head, such as from running into another player during a soccer game, being hit in a fight, or hitting your head on a hard surface.  A jolt of the head or neck that causes the brain to move back and forth inside the skull, such as in a car crash. SIGNS AND SYMPTOMS The signs of a concussion can be hard to notice. Early on, they may be missed by you, family members, and health care providers. You may look fine but act or feel differently. Symptoms are usually temporary, but they may last for days, weeks, or even longer. Some symptoms may appear right away while others may not show up for hours or days. Every head injury is different. Symptoms include:  Mild to moderate headaches that will not go away.  A feeling of pressure inside your head.  Having more trouble than usual:  Learning or remembering things you have heard.  Answering questions.  Paying attention or concentrating.  Organizing daily tasks.  Making decisions and solving problems.  Slowness in thinking, acting or reacting, speaking, or reading.  Getting lost or being easily confused.  Feeling  tired all the time or lacking energy (fatigued).  Feeling drowsy.  Sleep disturbances.  Sleeping more than usual.  Sleeping less than usual.  Trouble falling asleep.  Trouble sleeping (insomnia).  Loss of balance or feeling lightheaded or dizzy.  Nausea or vomiting.  Numbness or tingling.  Increased sensitivity to:  Sounds.  Lights.  Distractions.  Vision problems or eyes that tire easily.  Diminished sense of taste or smell.  Ringing in the ears.  Mood changes such as feeling sad or anxious.  Becoming easily irritated or angry for little or no reason.  Lack of motivation.  Seeing or hearing things other people do not see or hear (hallucinations). DIAGNOSIS Your health care provider can usually diagnose a concussion based on a description of your injury and symptoms. He or she will ask whether you passed out (lost consciousness) and whether you are having trouble remembering events that happened right before and during your injury. Your evaluation might include:  A brain scan to look for signs of injury to the brain. Even if the test shows no injury, you may still have a concussion.  Blood tests to be sure other problems are not present. TREATMENT  Concussions are usually treated in an emergency department, in urgent care, or at a clinic. You may need to stay in the hospital overnight for further treatment.  Tell your health care provider if you are taking any medicines, including prescription medicines, over-the-counter medicines, and natural remedies. Some medicines, such as blood thinners (anticoagulants) and aspirin, may increase the chance of complications. Also tell your health care provider whether you have had alcohol or are taking illegal drugs. This information may affect treatment.  Your health care provider will send you home with important instructions to follow.  How fast you will recover from a concussion depends on many factors. These factors  include how severe your concussion is, what part  of your brain was injured, your age, and how healthy you were before the concussion.  Most people with mild injuries recover fully. Recovery can take time. In general, recovery is slower in older persons. Also, persons who have had a concussion in the past or have other medical problems may find that it takes longer to recover from their current injury. HOME CARE INSTRUCTIONS General Instructions  Carefully follow the directions your health care provider gave you.  Only take over-the-counter or prescription medicines for pain, discomfort, or fever as directed by your health care provider.  Take only those medicines that your health care provider has approved.  Do not drink alcohol until your health care provider says you are well enough to do so. Alcohol and certain other drugs may slow your recovery and can put you at risk of further injury.  If it is harder than usual to remember things, write them down.  If you are easily distracted, try to do one thing at a time. For example, do not try to watch TV while fixing dinner.  Talk with family members or close friends when making important decisions.  Keep all follow-up appointments. Repeated evaluation of your symptoms is recommended for your recovery.  Watch your symptoms and tell others to do the same. Complications sometimes occur after a concussion. Older adults with a brain injury may have a higher risk of serious complications, such as a blood clot on the brain.  Tell your teachers, school nurse, school counselor, coach, athletic trainer, or work Production designer, theatre/television/film about your injury, symptoms, and restrictions. Tell them about what you can or cannot do. They should watch for:  Increased problems with attention or concentration.  Increased difficulty remembering or learning new information.  Increased time needed to complete tasks or assignments.  Increased irritability or decreased ability to  cope with stress.  Increased symptoms.  Rest. Rest helps the brain to heal. Make sure you:  Get plenty of sleep at night. Avoid staying up late at night.  Keep the same bedtime hours on weekends and weekdays.  Rest during the day. Take daytime naps or rest breaks when you feel tired.  Limit activities that require a lot of thought or concentration. These include:  Doing homework or job-related work.  Watching TV.  Working on the computer.  Avoid any situation where there is potential for another head injury (football, hockey, soccer, basketball, martial arts, downhill snow sports and horseback riding). Your condition will get worse every time you experience a concussion. You should avoid these activities until you are evaluated by the appropriate follow-up health care providers. Returning To Your Regular Activities You will need to return to your normal activities slowly, not all at once. You must give your body and brain enough time for recovery.  Do not return to sports or other athletic activities until your health care provider tells you it is safe to do so.  Ask your health care provider when you can drive, ride a bicycle, or operate heavy machinery. Your ability to react may be slower after a brain injury. Never do these activities if you are dizzy.  Ask your health care provider about when you can return to work or school. Preventing Another Concussion It is very important to avoid another brain injury, especially before you have recovered. In rare cases, another injury can lead to permanent brain damage, brain swelling, or death. The risk of this is greatest during the first 7-10 days after a head injury. Avoid injuries  by:  Wearing a seat belt when riding in a car.  Drinking alcohol only in moderation.  Wearing a helmet when biking, skiing, skateboarding, skating, or doing similar activities.  Avoiding activities that could lead to a second concussion, such as contact  or recreational sports, until your health care provider says it is okay.  Taking safety measures in your home.  Remove clutter and tripping hazards from floors and stairways.  Use grab bars in bathrooms and handrails by stairs.  Place non-slip mats on floors and in bathtubs.  Improve lighting in dim areas. SEEK MEDICAL CARE IF:  You have increased problems paying attention or concentrating.  You have increased difficulty remembering or learning new information.  You need more time to complete tasks or assignments than before.  You have increased irritability or decreased ability to cope with stress.  You have more symptoms than before. Seek medical care if you have any of the following symptoms for more than 2 weeks after your injury:  Lasting (chronic) headaches.  Dizziness or balance problems.  Nausea.  Vision problems.  Increased sensitivity to noise or light.  Depression or mood swings.  Anxiety or irritability.  Memory problems.  Difficulty concentrating or paying attention.  Sleep problems.  Feeling tired all the time. SEEK IMMEDIATE MEDICAL CARE IF:  You have severe or worsening headaches. These may be a sign of a blood clot in the brain.  You have weakness (even if only in one hand, leg, or part of the face).  You have numbness.  You have decreased coordination.  You vomit repeatedly.  You have increased sleepiness.  One pupil is larger than the other.  You have convulsions.  You have slurred speech.  You have increased confusion. This may be a sign of a blood clot in the brain.  You have increased restlessness, agitation, or irritability.  You are unable to recognize people or places.  You have neck pain.  It is difficult to wake you up.  You have unusual behavior changes.  You lose consciousness. MAKE SURE YOU:  Understand these instructions.  Will watch your condition.  Will get help right away if you are not doing well or  get worse. Document Released: 01/15/2004 Document Revised: 10/30/2013 Document Reviewed: 05/17/2013 [facility] Patient Information 2015 [place], [place]. This information is not intended to replace advice given to you by your health care provider. Make sure you discuss any questions you have with your health care provider.

## 2014-12-04 LAB — LAMOTRIGINE LEVEL: Lamotrigine Lvl: 5.2 ug/mL (ref 4.0–18.0)

## 2014-12-07 ENCOUNTER — Other Ambulatory Visit: Payer: Self-pay | Admitting: Family

## 2014-12-10 ENCOUNTER — Other Ambulatory Visit: Payer: Self-pay | Admitting: Family

## 2014-12-11 ENCOUNTER — Observation Stay (HOSPITAL_COMMUNITY)
Admission: EM | Admit: 2014-12-11 | Discharge: 2014-12-12 | Disposition: A | Discharge status: Home / Self Care | Payer: Managed Care, Other (non HMO) | Attending: Internal Medicine | Admitting: Internal Medicine

## 2014-12-11 ENCOUNTER — Emergency Department (HOSPITAL_COMMUNITY): Payer: Managed Care, Other (non HMO)

## 2014-12-11 DIAGNOSIS — R569 Unspecified convulsions: Secondary | ICD-10-CM

## 2014-12-11 DIAGNOSIS — R55 Syncope and collapse: Principal | ICD-10-CM | POA: Diagnosis present

## 2014-12-11 DIAGNOSIS — I951 Orthostatic hypotension: Secondary | ICD-10-CM | POA: Insufficient documentation

## 2014-12-11 DIAGNOSIS — F909 Attention-deficit hyperactivity disorder, unspecified type: Secondary | ICD-10-CM | POA: Diagnosis not present

## 2014-12-11 DIAGNOSIS — G40802 Other epilepsy, not intractable, without status epilepticus: Secondary | ICD-10-CM | POA: Insufficient documentation

## 2014-12-11 DIAGNOSIS — R000 Tachycardia, unspecified: Secondary | ICD-10-CM

## 2014-12-11 DIAGNOSIS — Z79899 Other long term (current) drug therapy: Secondary | ICD-10-CM | POA: Insufficient documentation

## 2014-12-11 DIAGNOSIS — G90A Postural orthostatic tachycardia syndrome (POTS): Secondary | ICD-10-CM | POA: Diagnosis present

## 2014-12-11 LAB — BASIC METABOLIC PANEL
Anion gap: 9 (ref 5–15)
BUN: 9 mg/dL (ref 6–23)
CO2: 24 mmol/L (ref 19–32)
Calcium: 9.9 mg/dL (ref 8.4–10.5)
Chloride: 107 mmol/L (ref 96–112)
Creatinine, Ser: 0.92 mg/dL (ref 0.50–1.10)
GFR calc Af Amer: >90 mL/min (ref >90)
GFR, EST NON AFRICAN AMERICAN: 87 mL/min — AB (ref >90)
Glucose, Bld: 112 mg/dL — ABNORMAL HIGH (ref 70–99)
POTASSIUM: 3.9 mmol/L (ref 3.5–5.1)
SODIUM: 140 mmol/L (ref 135–145)

## 2014-12-11 LAB — URINALYSIS, ROUTINE W REFLEX MICROSCOPIC
Bilirubin Urine: NEGATIVE
Glucose, UA: NEGATIVE mg/dL
Hgb urine dipstick: NEGATIVE
KETONES UR: NEGATIVE mg/dL
LEUKOCYTES UA: NEGATIVE
NITRITE: NEGATIVE
Protein, ur: NEGATIVE mg/dL
SPECIFIC GRAVITY, URINE: 1.013 (ref 1.005–1.030)
Urobilinogen, UA: 0.2 mg/dL (ref 0.0–1.0)
pH: 7.5 (ref 5.0–8.0)

## 2014-12-11 LAB — RAPID URINE DRUG SCREEN, HOSP PERFORMED
Amphetamines: NOT DETECTED
BARBITURATES: NOT DETECTED
BENZODIAZEPINES: NOT DETECTED
Cocaine: NOT DETECTED
OPIATES: NOT DETECTED
Tetrahydrocannabinol: NOT DETECTED

## 2014-12-11 LAB — URINALYSIS W MICROSCOPIC (NOT AT ARMC)
BILIRUBIN URINE: NEGATIVE
Glucose, UA: NEGATIVE mg/dL
Hgb urine dipstick: NEGATIVE
KETONES UR: NEGATIVE mg/dL
Leukocytes, UA: NEGATIVE
NITRITE: NEGATIVE
Protein, ur: NEGATIVE mg/dL
Specific Gravity, Urine: 1.014 (ref 1.005–1.030)
UROBILINOGEN UA: 0.2 mg/dL (ref 0.0–1.0)
pH: 7.5 (ref 5.0–8.0)

## 2014-12-11 LAB — CBC WITH DIFFERENTIAL/PLATELET
BASOS PCT: 0 % (ref 0–1)
Basophils Absolute: 0 10*3/uL (ref 0.0–0.1)
Eosinophils Absolute: 0.1 10*3/uL (ref 0.0–0.7)
Eosinophils Relative: 1 % (ref 0–5)
HCT: 40.9 % (ref 36.0–46.0)
HEMOGLOBIN: 13.4 g/dL (ref 12.0–15.0)
LYMPHS ABS: 1.2 10*3/uL (ref 0.7–4.0)
LYMPHS PCT: 17 % (ref 12–46)
MCH: 26.6 pg (ref 26.0–34.0)
MCHC: 32.8 g/dL (ref 30.0–36.0)
MCV: 81.2 fL (ref 78.0–100.0)
Monocytes Absolute: 0.8 10*3/uL (ref 0.1–1.0)
Monocytes Relative: 12 % (ref 3–12)
NEUTROS PCT: 70 % (ref 43–77)
Neutro Abs: 4.9 10*3/uL (ref 1.7–7.7)
Platelets: 285 10*3/uL (ref 150–400)
RBC: 5.04 MIL/uL (ref 3.87–5.11)
RDW: 14.5 % (ref 11.5–15.5)
WBC: 7 10*3/uL (ref 4.0–10.5)

## 2014-12-11 LAB — POC URINE PREG, ED: Preg Test, Ur: NEGATIVE

## 2014-12-11 MED ORDER — ACETAMINOPHEN 325 MG PO TABS: 650.0000 mg | ORAL_TABLET | Freq: Four times a day (QID) | ORAL | Status: DC | PRN

## 2014-12-11 MED ORDER — METOPROLOL SUCCINATE ER 25 MG PO TB24
12.5000 mg | ORAL_TABLET | ORAL | Status: DC
  Administered 2014-12-12: 12.5 mg via ORAL
  Filled 2014-12-11 (×2): qty 1

## 2014-12-11 MED ORDER — FLUDROCORTISONE ACETATE 0.1 MG PO TABS
0.1000 mg | ORAL_TABLET | ORAL | Status: DC
  Administered 2014-12-12: 0.1 mg via ORAL
  Filled 2014-12-11 (×4): qty 1

## 2014-12-11 MED ORDER — SODIUM CHLORIDE 0.9 % IJ SOLN
3.0000 mL | Freq: Two times a day (BID) | INTRAMUSCULAR | Status: DC
  Administered 2014-12-11 – 2014-12-12 (×2): 3 mL via INTRAVENOUS

## 2014-12-11 MED ORDER — LAMOTRIGINE 100 MG PO TABS
100.0000 mg | ORAL_TABLET | ORAL | Status: DC
  Administered 2014-12-12: 100 mg via ORAL
  Filled 2014-12-11: qty 1

## 2014-12-11 MED ORDER — ONDANSETRON HCL 4 MG PO TABS
4.0000 mg | ORAL_TABLET | Freq: Four times a day (QID) | ORAL | Status: DC | PRN
Start: 2014-12-11 — End: 2014-12-12

## 2014-12-11 MED ORDER — SODIUM CHLORIDE 0.9 % IV SOLN
INTRAVENOUS | Status: DC
  Administered 2014-12-11: 23:00:00 via INTRAVENOUS

## 2014-12-11 MED ORDER — LAMOTRIGINE 100 MG PO TABS: 100.0000 mg | ORAL_TABLET | Freq: Two times a day (BID) | ORAL | Status: DC

## 2014-12-11 MED ORDER — METOPROLOL SUCCINATE ER 25 MG PO TB24: 12.5000 mg | ORAL_TABLET | Freq: Every day | ORAL | Status: DC

## 2014-12-11 MED ORDER — ONDANSETRON HCL 4 MG/2ML IJ SOLN: 4.0000 mg | Freq: Four times a day (QID) | INTRAMUSCULAR | Status: DC | PRN

## 2014-12-11 MED ORDER — ACETAMINOPHEN 650 MG RE SUPP: 650.0000 mg | Freq: Four times a day (QID) | RECTAL | Status: DC | PRN

## 2014-12-11 MED ORDER — MIDODRINE HCL 5 MG PO TABS
10.0000 mg | ORAL_TABLET | ORAL | Status: DC
  Administered 2014-12-12 (×3): 10 mg via ORAL
  Filled 2014-12-11 (×3): qty 2

## 2014-12-11 NOTE — ED Provider Notes (Signed)
CSN: [id number]     Arrival date & time 12/11/14  1713 History   First MD Initiated Contact with Patient 12/11/14 1718     Chief Complaint  Patient presents with  . Near Syncope     (Consider location/radiation/quality/duration/timing/severity/associated sxs/prior Treatment) HPI Comments:  Patient here after having syncopal episode prior to arrival. Patient  has a history of pots syndrome and the episode happened when she stood up. She was recently started on Cipro for UTI as well as placed on a beta blocker yesterday due to tachycardia. Mother observed with sounds like seizure activity with a postictal phase. She denies any headache at this time. Was seen at [facility] week ago after passing out and had a head CT which was negative at that time. Patient's hematoma on her occipital region has resolved. She denies any unilateral weakness. She feels back to her baseline at this time  Patient is a 24 y.o. female presenting with near-syncope. The history is provided by the patient.  Near Syncope    Past Medical History  Diagnosis Date  . POTS (postural orthostatic tachycardia syndrome)   . Photosensitive reflex epilepsy   . ADHD (attention deficit hyperactivity disorder)   . Seizures   . Orthostatic hypotension dysautonomic syndrome    Past Surgical History  Procedure Laterality Date  . No past surgeries     Family History  Problem Relation Age of Onset  . Thyroid disease Mother   . Seizures Mother   . Cancer Paternal Grandfather     Died at 24   History  Substance Use Topics  . Smoking status: Never Smoker   . Smokeless tobacco: Never Used  . Alcohol Use: No   OB History    No data available     Review of Systems  Cardiovascular: Positive for near-syncope.  All other systems reviewed and are negative.     Allergies  Dye fdc red and Other  Home Medications   Prior to Admission medications   Medication Sig Start Date End Date Taking? Authorizing Provider   fludrocortisone (FLORINEF) 0.1 MG tablet Take 0.1 mg by mouth 2 (two) times daily.     Historical Provider, MD  lamoTRIgine (LAMICTAL) 100 MG tablet TAKE 1 TABLET (100 MG TOTAL) BY MOUTH 3 (THREE) TIMES DAILY. Patient not taking: Reported on 12/02/2014    Elveria Rising, NP  lamoTRIgine (LAMICTAL) 100 MG tablet Take 100 mg by mouth 2 (two) times daily.    Historical Provider, MD  lamoTRIgine (LAMICTAL) 100 MG tablet TAKE 1 TABLET (100 MG TOTAL) BY MOUTH 3 (THREE) TIMES DAILY. 12/09/14   Elveria Rising, NP  midodrine (PROAMATINE) 10 MG tablet Take 10 mg by mouth 3 (three) times daily.    Historical Provider, MD   BP 129/88 mmHg  Pulse 98  Temp(Src) 98.3 F (36.8 C) (Oral)  Resp 13  SpO2 99%  LMP 10/15/2014 Physical Exam  Constitutional: She is oriented to person, place, and time. She appears well-developed and well-nourished.  Non-toxic appearance. No distress.  HENT:  Head: Normocephalic and atraumatic.  Eyes: Conjunctivae, EOM and lids are normal. Pupils are equal, round, and reactive to light.  Neck: Normal range of motion. Neck supple. No tracheal deviation present. No thyroid mass present.  Cardiovascular: Normal rate, regular rhythm and normal heart sounds.  Exam reveals no gallop.   No murmur heard. Pulmonary/Chest: Effort normal and breath sounds normal. No stridor. No respiratory distress. She has no decreased breath sounds. She has no wheezes. She  has no rhonchi. She has no rales.  Abdominal: Soft. Normal appearance and bowel sounds are normal. She exhibits no distension. There is no tenderness. There is no rebound and no CVA tenderness.  Musculoskeletal: Normal range of motion. She exhibits no edema or tenderness.  Neurological: She is alert and oriented to person, place, and time. She has normal strength. No cranial nerve deficit or sensory deficit. GCS eye subscore is 4. GCS verbal subscore is 5. GCS motor subscore is 6.  Skin: Skin is warm and dry. No abrasion and no rash  noted.  Psychiatric: She has a normal mood and affect. Her speech is normal and behavior is normal.  Nursing note and vitals reviewed.   ED Course  Procedures (including critical care time) Labs Review Labs Reviewed  CBC WITH DIFFERENTIAL/PLATELET  BASIC METABOLIC PANEL  URINALYSIS, ROUTINE W REFLEX MICROSCOPIC  POC URINE PREG, ED    Imaging Review No results found.   EKG Interpretation   Date/Time:  Wednesday December 11 2014 17:23:33 EST Ventricular Rate:  101 PR Interval:  142 QRS Duration: 87 QT Interval:  343 QTC Calculation: 445 R Axis:   86 Text Interpretation:  Sinus tachycardia RSR' in V1 or V2, right VCD or RVH  No significant change since last tracing Confirmed by Darria Corvera  MD, Sabatino Williard  ([id number]) on 12/11/2014 5:35:42 PM      MDM   Final diagnoses:  Seizure      Patient seen by neurology and will be admitted for evaluation of possible seizure  Toy BakerAnthony T Maanav Kassabian, MD 12/11/14 2031

## 2014-12-11 NOTE — H&P (Signed)
[facility] History and Physical  Patient: Jennifer Galloway  MRN: [id number]  DOB: 21-Oct-1991  DOS: the patient was seen and examined on 12/11/2014 PCP: Angelica Chessman., MD  Chief Complaint: Passing out episode  HPI: Jennifer Galloway is a 24 y.o. female with Past medical history of POT syndrome, photosensitive reflex epilepsy, ADHD. The patient is presenting with complaints of a syncopal episode. Patient is following up with cardiology at [facility] for her POT syndrome. Patient recently had a loop recorder which found that patient was having increased events of tachycardia. Due to that the patient was placed on low-dose beta blocker metoprolol 1 12/09/2014. Patient had complaints of fatigue and tiredness and went to see her primary care physician. Mother mentions that patient did not have any UTI symptoms but the note mentions that the patient has complained of increased urinary frequency and an order for last 1 week. A UA was performed which showed presence of leukocytes esterase but otherwise was unremarkable. The patient was placed on Cipro on 2-16. Patient took her Cipro for the first time on 12/11/2014 in the morning after taking within 30 minutes she had a syncopal episode. As per mother this felt like one of her routine syncopal episode as the patient was able to turn around and communicate without any confusion or prolonged immobilization or passing out. Patient had another episode at 4:30 PM while she was sitting on the couch and lean on left side and passed out. This time the patient was more confused and had a little bit of incompetence of her speech which lasted for a few minutes. By the time EMS arrived the patient was back to her baseline. At the time of my evaluation patient's mother mentions patient is asked her baseline and has not had any further events. Patient mother as well as patient denies any urinary symptoms nor there was any fever, chills, cough, chest  pain, shortness of breath, vomiting, diarrhea, burning urination, loss of control of bowel or bladder, focal deficit. Patient has been compliant with all her medications.  The patient is coming from home. And at her baseline independent for most of her ADL.  Review of Systems: as mentioned in the history of present illness.  A Comprehensive review of the other systems is negative.  Past Medical History  Diagnosis Date  . POTS (postural orthostatic tachycardia syndrome)   . Photosensitive reflex epilepsy   . ADHD (attention deficit hyperactivity disorder)   . Seizures   . Orthostatic hypotension dysautonomic syndrome    Past Surgical History  Procedure Laterality Date  . No past surgeries     Social History:  reports that she has never smoked. She has never used smokeless tobacco. She reports that she does not drink alcohol or use illicit drugs.  Allergies  Allergen Reactions  . Dye Fdc Red [Red Dye] Other (See Comments)    Allergy to Lamictal dye  . Other Other (See Comments)    Allergy to artificial sweetners    Family History  Problem Relation Age of Onset  . Thyroid disease Mother   . Seizures Mother   . Cancer Paternal Grandfather     Died at 56    Prior to Admission medications   Medication Sig Start Date End Date Taking? Authorizing Provider  fludrocortisone (FLORINEF) 0.1 MG tablet Take 0.1 mg by mouth 2 (two) times daily. 4:30 and 10 PM   Yes Historical Provider, MD  lamoTRIgine (LAMICTAL) 100 MG tablet Take 100 mg by mouth 2 (  two) times daily. 4:30 AND 10 PM   Yes Historical Provider, MD  metoprolol succinate (TOPROL-XL) 25 MG 24 hr tablet Take 12.5 mg by mouth daily.   Yes Historical Provider, MD  midodrine (PROAMATINE) 5 MG tablet Take 10 mg by mouth 3 (three) times daily with meals. 4:30, 11, 4   Yes Historical Provider, MD  lamoTRIgine (LAMICTAL) 100 MG tablet TAKE 1 TABLET (100 MG TOTAL) BY MOUTH 3 (THREE) TIMES DAILY. Patient not taking: Reported on 12/11/2014     Elveria Risingina Goodpasture, NP  lamoTRIgine (LAMICTAL) 100 MG tablet TAKE 1 TABLET (100 MG TOTAL) BY MOUTH 3 (THREE) TIMES DAILY. Patient not taking: Reported on 12/11/2014 12/09/14   Elveria Risingina Goodpasture, NP    Physical Exam: Filed Vitals:   12/11/14 2000 12/11/14 2100 12/11/14 2200 12/11/14 2227  BP: 124/85 119/78 112/70 127/74  Pulse: 86 85 87 78  Temp:    98.6 F (37 C)  TempSrc:    Oral  Resp: 12 16 11 12   Height:    5\' 10"  (1.778 m)  Weight:    65.772 kg (145 lb)  SpO2: 99% 97% 96% 98%    General: Alert, Awake and Oriented to Time, Place and Person. Appear in mild distress Eyes: PERRL ENT: Oral Mucosa clear moist. Neck: no JVD Cardiovascular: S1 and S2 Present, no Murmur, Peripheral Pulses Present Respiratory: Bilateral Air entry equal and Decreased, Clear to Auscultation, noCrackles, no wheezes Abdomen: Bowel Sound present, Soft and non tender Skin: no Rash Extremities: no Pedal edema, no calf tenderness Neurologic: Grossly no focal neuro deficit.  Labs on Admission:  CBC:  Recent Labs Lab 12/11/14 1722  WBC 7.0  NEUTROABS 4.9  HGB 13.4  HCT 40.9  MCV 81.2  PLT 285    CMP     Component Value Date/Time   NA 140 12/11/2014 1722   K 3.9 12/11/2014 1722   CL 107 12/11/2014 1722   CO2 24 12/11/2014 1722   GLUCOSE 112* 12/11/2014 1722   BUN 9 12/11/2014 1722   CREATININE 0.92 12/11/2014 1722   CALCIUM 9.9 12/11/2014 1722   GFRNONAA 87* 12/11/2014 1722   GFRAA >90 12/11/2014 1722    No results for input(s): LIPASE, AMYLASE in the last 168 hours.  No results for input(s): CKTOTAL, CKMB, CKMBINDEX, TROPONINI in the last 168 hours. BNP (last 3 results) No results for input(s): BNP in the last 8760 hours.  ProBNP (last 3 results) No results for input(s): PROBNP in the last 8760 hours.   Radiological Exams on Admission: Ct Head Wo Contrast  12/11/2014   CLINICAL DATA:  24 year old female with postural orthostatic tachycardic syndrome with history of recent fall and  injury to the head. Syncope.  EXAM: CT HEAD WITHOUT CONTRAST  TECHNIQUE: Contiguous axial images were obtained from the base of the skull through the vertex without intravenous contrast.  COMPARISON:  Head CT 12/02/2014.  FINDINGS: No acute intracranial abnormalities. Specifically, no evidence of acute intracranial hemorrhage, no definite findings of acute/subacute cerebral ischemia, no mass, mass effect, hydrocephalus or abnormal intra or extra-axial fluid collections. Visualized paranasal sinuses and mastoids are well pneumatized. No acute displaced skull fractures are identified.  IMPRESSION: 1. No acute intracranial abnormalities. 2. The appearance of the brain is normal. 3. Previously noted left parietal scalp hematoma has resolved.   Electronically Signed   By: Trudie Reedaniel  Entrikin M.D.   On: 12/11/2014 19:50   EKG: Independently reviewed. normal sinus rhythm, nonspecific ST and T waves changes.  Assessment/Plan Principal Problem:  Syncope Active Problems:   POTS (postural orthostatic tachycardia syndrome)   ADHD (attention deficit hyperactivity disorder)   Seizure   1. Syncope The patient is presenting with complaints of passing out episode. She had 2 episodes in one day one of which was her typical syncopal episode but then the other one was associated with prolonged incompetence as well as confusion. Patient was initially seen by neurology recommended further seizure workup but since patient mother was not comfortable to get the workup as an outpatient patient was recommended to be admitted to complete the workup. At the time of my evaluation patient does not appear to have any ongoing symptoms. CT scan is negative EKG is also not showing any acute abnormality. Workup showing electrolytes as well as WBCs as well as urinalysis does not show any evidence of infection. UDS is also clear. Patient will be admitted in telemetry for observation. We'll get orthostatic evaluation in the  morning. MRI brain as well as EEG in the morning. Continuing home medications. At present I do not thing that the patient has a UTI and therefore we will be holding off on adding any medications.  2. POTS Continue metoprolol at home doses. As well as continue other home medications. Gentle IV hydration.  Advance goals of care discussion: Full code   Consults: Neurology  DVT Prophylaxis: mechanical compression device Nutrition: Regular diet  Family Communication: Mother was present at bedside, opportunity was given to ask question and all questions were answered satisfactorily at the time of interview. Disposition: Admitted to observation in telemetry unit.  Author: Lynden Oxford, MD [facility] Pager: [phone] 12/11/2014, 11:54 PM    If 7PM-7AM, please contact night-coverage [email or web address] Password [email or web address]

## 2014-12-11 NOTE — ED Notes (Signed)
Loop recorder interrogated by charge nurse

## 2014-12-11 NOTE — ED Notes (Signed)
Dr Allena KatzPatel out of room

## 2014-12-11 NOTE — ED Notes (Signed)
Dr. Isaias CowmanAllan said pt could eat and drink. Pt given soda and [place] sand witch.

## 2014-12-11 NOTE — Consult Note (Signed)
NEURO HOSPITALIST CONSULT NOTE    Reason for Consult: seizure versus syncope  HPI:                                                                                                                                          Jennifer Galloway is an 24 y.o. female with a past medical history significant for POTS, photosensitive reflex epilepsy, and ADHD, brought in for further evaluation of a transient episode of loss of consciousness. She has a loop recorder implanted. Mother is at the bedside and said that her daughter was siting in a chair working in her computer when suddenly became less responsive, fell to a pillow in the right side, and started having twitching movements around her eyes for a couple of minutes. When she regained consciousness, she was combative, making no sense when talking, and confused. No reported bladder or bowel incontinence, tongue biting, or abnormal movements of arms or legs. Patient is follow by cardiology [facility] who advised mother to come to the ED for neurological testing. She was recently started on Cipro for UTI as well as placed on a beta blocker yesterday due to tachycardia, and was seen at [facility] week ago after passing out and had a head CT which was negative. Denies HA, vertigo, double vision, focal weakness or numbness, slurred speech, language or vision impairment. I did review her CT brain which was unremarkable. Patient has been on lamotrigine 100 mg BID for several years. Mother said that prior seizure evaluation years ago was unrevealing.  Past Medical History  Diagnosis Date  . POTS (postural orthostatic tachycardia syndrome)   . Photosensitive reflex epilepsy   . ADHD (attention deficit hyperactivity disorder)   . Seizures   . Orthostatic hypotension dysautonomic syndrome     Past Surgical History  Procedure Laterality Date  . No past surgeries      Family History  Problem Relation Age of Onset   . Thyroid disease Mother   . Seizures Mother   . Cancer Paternal Grandfather     Died at 61    Family History: mother with seizures, but no brain tumors or brain aneurysms.  Social History:  reports that she has never smoked. She has never used smokeless tobacco. She reports that she does not drink alcohol or use illicit drugs.  Allergies  Allergen Reactions  . Dye Fdc Red [Red Dye] Other (See Comments)    Allergy to Lamictal dye  . Other Other (See Comments)    Allergy to artificial sweetners    MEDICATIONS:  I have reviewed the patient's current medications.   ROS:                                                                                                                                       History obtained from the patient, mother, and chart review  General ROS: negative for - chills, fatigue, fever, night sweats, weight gain or weight loss Psychological ROS: negative for - behavioral disorder, hallucinations, memory difficulties, mood swings or suicidal ideation Ophthalmic ROS: negative for - blurry vision, double vision, eye pain or loss of vision ENT ROS: negative for - epistaxis, nasal discharge, oral lesions, sore throat, tinnitus or vertigo Allergy and Immunology ROS: negative for - hives or itchy/watery eyes Hematological and Lymphatic ROS: negative for - bleeding problems, bruising or swollen lymph nodes Endocrine ROS: negative for - galactorrhea, hair pattern changes, polydipsia/polyuria or temperature intolerance Respiratory ROS: negative for - cough, hemoptysis, shortness of breath or wheezing Cardiovascular ROS: negative for - chest pain, dyspnea on exertion, edema or irregular heartbeat Gastrointestinal ROS: negative for - abdominal pain, diarrhea, hematemesis, nausea/vomiting or stool incontinence Genito-Urinary ROS: negative for - dysuria,  hematuria, incontinence or urinary frequency/urgency Musculoskeletal ROS: negative for - joint swelling or muscular weakness Neurological ROS: as noted in HPI Dermatological ROS: negative for rash and skin lesion changes  Physical exam: pleasant female in no apparent distress. Blood pressure 129/88, pulse 98, temperature 98.3 F (36.8 C), temperature source Oral, resp. rate 13, last menstrual period 10/15/2014, SpO2 99 %. Head: normocephalic. Neck: supple, no bruits, no JVD. Cardiac: no murmurs. Lungs: clear. Abdomen: soft, no tender, no mass. Extremities: no edema. Skin: no rash Neurologic Examination:                                                                                                      General: Mental Status: Alert, oriented, thought content appropriate.  Speech fluent without evidence of aphasia.  Able to follow 3 step commands without difficulty. Cranial Nerves: II: Discs flat bilaterally; Visual fields grossly normal, pupils equal, round, reactive to light and accommodation III,IV, VI: ptosis not present, extra-ocular motions intact bilaterally V,VII: smile symmetric, facial light touch sensation normal bilaterally VIII: hearing normal bilaterally IX,X: gag reflex present XI: bilateral shoulder shrug XII: midline tongue extension without atrophy or fasciculations  Motor: Right : Upper extremity   5/5    Left:     Upper extremity   5/5  Lower extremity   5/5  Lower extremity   5/5 Tone and bulk:normal tone throughout; no atrophy noted Sensory: Pinprick and light touch intact throughout, bilaterally Deep Tendon Reflexes:  Right: Upper Extremity   Left: Upper extremity   biceps (C-5 to C-6) 2/4   biceps (C-5 to C-6) 2/4 tricep (C7) 2/4    triceps (C7) 2/4 Brachioradialis (C6) 2/4  Brachioradialis (C6) 2/4  Lower Extremity Lower Extremity  quadriceps (L-2 to L-4) 2/4   quadriceps (L-2 to L-4) 2/4 Achilles (S1) 2/4   Achilles (S1) 2/4  Plantars: Right:  downgoing   Left: downgoing Cerebellar: normal finger-to-nose,  normal heel-to-shin test Gait:  No tested due to multiple leads  No results found for: CHOL  Results for orders placed or performed during the hospital encounter of 12/11/14 (from the past 48 hour(s))  CBC with Differential/Platelet     Status: None   Collection Time: 12/11/14  5:22 PM  Result Value Ref Range   WBC 7.0 4.0 - 10.5 K/uL   RBC 5.04 3.87 - 5.11 MIL/uL   Hemoglobin 13.4 12.0 - 15.0 g/dL   HCT 16.1 09.6 - 04.5 %   MCV 81.2 78.0 - 100.0 fL   MCH 26.6 26.0 - 34.0 pg   MCHC 32.8 30.0 - 36.0 g/dL   RDW 40.9 81.1 - 91.4 %   Platelets 285 150 - 400 K/uL   Neutrophils Relative % 70 43 - 77 %   Neutro Abs 4.9 1.7 - 7.7 K/uL   Lymphocytes Relative 17 12 - 46 %   Lymphs Abs 1.2 0.7 - 4.0 K/uL   Monocytes Relative 12 3 - 12 %   Monocytes Absolute 0.8 0.1 - 1.0 K/uL   Eosinophils Relative 1 0 - 5 %   Eosinophils Absolute 0.1 0.0 - 0.7 K/uL   Basophils Relative 0 0 - 1 %   Basophils Absolute 0.0 0.0 - 0.1 K/uL    No results found.  Assessment/Plan: 24 y/o with a history of recurrent syncope in the setting of POTS, brought in after sustaining an episode of LOC with unusually prolonged confusion and combativeness after regaining full consciousness. Neuro-exam is non focal and CT brain unremarkable for acute abnormality. Based on clinical grounds I can not determine whether she had a convulsive syncope or a seizure. Mother doesn't feel comfortable taking patient back home and getting further neuro testing as outpatient, and thus will suggest admission to the hospital and getting EEG and MRI brain in the morning. Will continue current dose lamotrigine. Will follow up.  Wyatt Portela, MD 12/11/2014, 6:16 PM  [facility].

## 2014-12-11 NOTE — ED Notes (Signed)
Dr. Patel in to see patient

## 2014-12-11 NOTE — ED Notes (Signed)
Lamotrigine 100mg  and Fludrocortisone 0.1mg  taken from patients own meds.  Dr. Allena KatzPatel aware

## 2014-12-11 NOTE — ED Notes (Addendum)
Pt has postural orthostatic tachycardic syndrome. She has had it since 4th grade. A week ago, pt. Fell and hit head. Has a hematoma. Had a syncope episode. Here b/c mom said, "pt. Acted differently; speech was off and out of it." happened at 8 am and 1545. Pt. Just started on lopressor and cipro for uti.

## 2014-12-11 NOTE — ED Notes (Signed)
Mitch from [facility] called and stated no tachy episodes since Jan 13.  Device is working properly.  Report will be faxed

## 2014-12-11 NOTE — Progress Notes (Addendum)
Patient arrived to 4N12 at 2230 alert and oriented no complaints of pain at this time.  Mom at the bedside.

## 2014-12-12 ENCOUNTER — Observation Stay (HOSPITAL_COMMUNITY): Payer: Managed Care, Other (non HMO)

## 2014-12-12 DIAGNOSIS — R569 Unspecified convulsions: Secondary | ICD-10-CM

## 2014-12-12 DIAGNOSIS — I951 Orthostatic hypotension: Secondary | ICD-10-CM

## 2014-12-12 DIAGNOSIS — R000 Tachycardia, unspecified: Secondary | ICD-10-CM

## 2014-12-12 LAB — CBC WITH DIFFERENTIAL/PLATELET
Basophils Absolute: 0 10*3/uL (ref 0.0–0.1)
Basophils Relative: 1 % (ref 0–1)
Eosinophils Absolute: 0.1 10*3/uL (ref 0.0–0.7)
Eosinophils Relative: 2 % (ref 0–5)
HCT: 41.1 % (ref 36.0–46.0)
Hemoglobin: 13 g/dL (ref 12.0–15.0)
LYMPHS ABS: 1.8 10*3/uL (ref 0.7–4.0)
LYMPHS PCT: 28 % (ref 12–46)
MCH: 26 pg (ref 26.0–34.0)
MCHC: 31.6 g/dL (ref 30.0–36.0)
MCV: 82.2 fL (ref 78.0–100.0)
MONOS PCT: 13 % — AB (ref 3–12)
Monocytes Absolute: 0.8 10*3/uL (ref 0.1–1.0)
Neutro Abs: 3.5 10*3/uL (ref 1.7–7.7)
Neutrophils Relative %: 56 % (ref 43–77)
PLATELETS: 287 10*3/uL (ref 150–400)
RBC: 5 MIL/uL (ref 3.87–5.11)
RDW: 14.7 % (ref 11.5–15.5)
WBC: 6.2 10*3/uL (ref 4.0–10.5)

## 2014-12-12 LAB — COMPREHENSIVE METABOLIC PANEL
ALT: 13 U/L (ref 0–35)
AST: 17 U/L (ref 0–37)
Albumin: 4.3 g/dL (ref 3.5–5.2)
Alkaline Phosphatase: 77 U/L (ref 39–117)
Anion gap: 5 (ref 5–15)
BUN: 9 mg/dL (ref 6–23)
CALCIUM: 9.2 mg/dL (ref 8.4–10.5)
CO2: 27 mmol/L (ref 19–32)
CREATININE: 0.96 mg/dL (ref 0.50–1.10)
Chloride: 108 mmol/L (ref 96–112)
GFR calc Af Amer: >90 mL/min (ref >90)
GFR calc non Af Amer: 83 mL/min — ABNORMAL LOW (ref >90)
GLUCOSE: 104 mg/dL — AB (ref 70–99)
Potassium: 3.8 mmol/L (ref 3.5–5.1)
Sodium: 140 mmol/L (ref 135–145)
Total Bilirubin: 0.8 mg/dL (ref 0.3–1.2)
Total Protein: 7 g/dL (ref 6.0–8.3)

## 2014-12-12 LAB — PHOSPHORUS: Phosphorus: 3.9 mg/dL (ref 2.3–4.6)

## 2014-12-12 LAB — MAGNESIUM: Magnesium: 2.1 mg/dL (ref 1.5–2.5)

## 2014-12-12 MED ORDER — LAMOTRIGINE 100 MG PO TABS: 100.0000 mg | ORAL_TABLET | Freq: Two times a day (BID) | ORAL | Status: DC

## 2014-12-12 NOTE — Discharge Instructions (Signed)
Syncope °Syncope is a medical term for fainting or passing out. This means you lose consciousness and drop to the ground. People are generally unconscious for less than 5 minutes. You may have some muscle twitches for up to 15 seconds before waking up and returning to normal. Syncope occurs more often in older adults, but it can happen to anyone. While most causes of syncope are not dangerous, syncope can be a sign of a serious medical problem. It is important to seek medical care.  °CAUSES  °Syncope is caused by a sudden drop in blood flow to the brain. The specific cause is often not determined. Factors that can bring on syncope include: °· Taking medicines that lower blood pressure. °· Sudden changes in posture, such as standing up quickly. °· Taking more medicine than prescribed. °· Standing in one place for too long. °· Seizure disorders. °· Dehydration and excessive exposure to heat. °· Low blood sugar (hypoglycemia). °· Straining to have a bowel movement. °· Heart disease, irregular heartbeat, or other circulatory problems. °· Fear, emotional distress, seeing blood, or severe pain. °SYMPTOMS  °Right before fainting, you may: °· Feel dizzy or light-headed. °· Feel nauseous. °· See all white or all black in your field of vision. °· Have cold, clammy skin. °DIAGNOSIS  °Your health care provider will ask about your symptoms, perform a physical exam, and perform an electrocardiogram (ECG) to record the electrical activity of your heart. Your health care provider may also perform other heart or blood tests to determine the cause of your syncope which may include: °· Transthoracic echocardiogram (TTE). During echocardiography, sound waves are used to evaluate how blood flows through your heart. °· Transesophageal echocardiogram (TEE). °· Cardiac monitoring. This allows your health care provider to monitor your heart rate and rhythm in real time. °· Holter monitor. This is a portable device that records your  heartbeat and can help diagnose heart arrhythmias. It allows your health care provider to track your heart activity for several days, if needed. °· Stress tests by exercise or by giving medicine that makes the heart beat faster. °TREATMENT  °In most cases, no treatment is needed. Depending on the cause of your syncope, your health care provider may recommend changing or stopping some of your medicines. °HOME CARE INSTRUCTIONS °· Have someone stay with you until you feel stable. °· Do not drive, use machinery, or play sports until your health care provider says it is okay. °· Keep all follow-up appointments as directed by your health care provider. °· Lie down right away if you start feeling like you might faint. Breathe deeply and steadily. Wait until all the symptoms have passed. °· Drink enough fluids to keep your urine clear or pale yellow. °· If you are taking blood pressure or heart medicine, get up slowly and take several minutes to sit and then stand. This can reduce dizziness. °SEEK IMMEDIATE MEDICAL CARE IF:  °· You have a severe headache. °· You have unusual pain in the chest, abdomen, or back. °· You are bleeding from your mouth or rectum, or you have black or tarry stool. °· You have an irregular or very fast heartbeat. °· You have pain with breathing. °· You have repeated fainting or seizure-like jerking during an episode. °· You faint when sitting or lying down. °· You have confusion. °· You have trouble walking. °· You have severe weakness. °· You have vision problems. °If you fainted, call your local emergency services (911 in [place] Do not drive   yourself to the hospital.  MAKE SURE YOU:  Understand these instructions.  Will watch your condition.  Will get help right away if you are not doing well or get worse. Document Released: 10/25/2005 Document Revised: 10/30/2013 Document Reviewed: 12/24/2011 [facility] Patient Information 2015 [employer], [place]. This information is not intended to replace  advice given to you by your health care provider. Make sure you discuss any questions you have with your health care provider.  Epilepsy Epilepsy is a disorder in which a person has repeated seizures over time. A seizure is a release of abnormal electrical activity in the brain. Seizures can cause a change in attention, behavior, or the ability to remain awake and alert (altered mental status). Seizures often involve uncontrollable shaking (convulsions).  Most people with epilepsy lead normal lives. However, people with epilepsy are at an increased risk of falls, accidents, and injuries. Therefore, it is important to begin treatment right away. CAUSES  Epilepsy has many possible causes. Anything that disturbs the normal pattern of brain cell activity can lead to seizures. This may include:   Head injury.  Birth trauma.  High fever as a child.  Stroke.  Bleeding into or around the brain.  Certain drugs.  Prolonged low oxygen, such as what occurs after CPR efforts.  Abnormal brain development.  Certain illnesses, such as meningitis, encephalitis (brain infection), malaria, and other infections.  An imbalance of nerve signaling chemicals (neurotransmitters).  SIGNS AND SYMPTOMS  The symptoms of a seizure can vary greatly from one person to another. Right before a seizure, you may have a warning (aura) that a seizure is about to occur. An aura may include the following symptoms:  Fear or anxiety.  Nausea.  Feeling like the room is spinning (vertigo).  Vision changes, such as seeing flashing lights or spots. Common symptoms during a seizure include:  Abnormal sensations, such as an abnormal smell or a bitter taste in the mouth.   Sudden, general body stiffness.   Convulsions that involve rhythmic jerking of the face, arm, or leg on one or both sides.   Sudden change in consciousness.   Appearing to be awake but not responding.   Appearing to be asleep but cannot be  awakened.   Grimacing, chewing, lip smacking, drooling, tongue biting, or loss of bowel or bladder control. After a seizure, you may feel sleepy for a while. DIAGNOSIS  Your health care provider will ask about your symptoms and take a medical history. Descriptions from any witnesses to your seizures will be very helpful in the diagnosis. A physical exam, including a detailed neurological exam, is necessary. Various tests may be done, such as:   An electroencephalogram (EEG). This is a painless test of your brain waves. In this test, a diagram is created of your brain waves. These diagrams can be interpreted by a specialist.  An MRI of the brain.   A CT scan of the brain.   A spinal tap (lumbar puncture, LP).  Blood tests to check for signs of infection or abnormal blood chemistry. TREATMENT  There is no cure for epilepsy, but it is generally treatable. Once epilepsy is diagnosed, it is important to begin treatment as soon as possible. For most people with epilepsy, seizures can be controlled with medicines. The following may also be used:  A pacemaker for the brain (vagus nerve stimulator) can be used for people with seizures that are not well controlled by medicine.  Surgery on the brain. For some people, epilepsy eventually  goes away. HOME CARE INSTRUCTIONS   Follow your health care provider's recommendations on driving and safety in normal activities.  Get enough rest. Lack of sleep can cause seizures.  Only take over-the-counter or prescription medicines as directed by your health care provider. Take any prescribed medicine exactly as directed.  Avoid any known triggers of your seizures.  Keep a seizure diary. Record what you recall about any seizure, especially any possible trigger.   Make sure the people you live and work with know that you are prone to seizures. They should receive instructions on how to help you. In general, a witness to a seizure should:   Cushion  your head and body.   Turn you on your side.   Avoid unnecessarily restraining you.   Not place anything inside your mouth.   Call for emergency medical help if there is any question about what has occurred.   Follow up with your health care provider as directed. You may need regular blood tests to monitor the levels of your medicine.  SEEK MEDICAL CARE IF:   You develop signs of infection or other illness. This might increase the risk of a seizure.   You seem to be having more frequent seizures.   Your seizure pattern is changing.  SEEK IMMEDIATE MEDICAL CARE IF:   You have a seizure that does not stop after a few moments.   You have a seizure that causes any difficulty in breathing.   You have a seizure that results in a very severe headache.   You have a seizure that leaves you with the inability to speak or use a part of your body.  Document Released: 10/25/2005 Document Revised: 08/15/2013 Document Reviewed: 06/06/2013 [facility] Patient Information 2015 [place], [place]. This information is not intended to replace advice given to you by your health care provider. Make sure you discuss any questions you have with your health care provider.

## 2014-12-12 NOTE — Progress Notes (Signed)
UR completed 

## 2014-12-12 NOTE — Progress Notes (Addendum)
Subjective: No further issues over night.   Objective: Current vital signs: BP 113/69 mmHg  Pulse 75  Temp(Src) 98.3 F (36.8 C) (Oral)  Resp 16  Ht  (1.778 m)  Wt 65.6 kg (144 lb 10 oz)  BMI 20.75 kg/m2  SpO2 98%  LMP 10/15/2014 Vital signs in last 24 hours: Temp:  [98.3 F (36.8 C)-99.5 F (37.5 C)] 98.3 F (36.8 C) (02/04 0939) Pulse Rate:  [74-99] 75 (02/04 0939) Resp:  [11-22] 16 (02/04 0939) BP: (102-129)/(59-88) 113/69 mmHg (02/04 0939) SpO2:  [96 %-99 %] 98 % (02/04 0939) Weight:  [65.6 kg (144 lb 10 oz)-65.772 kg (145 lb)] 65.6 kg (144 lb 10 oz) (02/04 0500)  Intake/Output from previous day:   Intake/Output this shift:   Nutritional status: Diet regular  Neurologic Exam:  Mental Status: Alert, oriented, thought content appropriate.  Speech fluent without evidence of aphasia.  Able to follow 3 step commands without difficulty. Cranial Nerves: II: Discs flat bilaterally; Visual fields grossly normal, pupils equal, round, reactive to light and accommodation III,IV, VI: ptosis not present, extra-ocular motions intact bilaterally V,VII: smile symmetric, facial light touch sensation normal bilaterally VIII: hearing normal bilaterally IX,X: gag reflex present XI: bilateral shoulder shrug XII: midline tongue extension without atrophy or fasciculations  Motor: Right : Upper extremity   5/5    Left:     Upper extremity   5/5  Lower extremity   5/5     Lower extremity   5/5 Tone and bulk:normal tone throughout; no atrophy noted Sensory: Pinprick and light touch intact throughout, bilaterally Deep Tendon Reflexes:  Right: Upper Extremity   Left: Upper extremity   biceps (C-5 to C-6) 2/4   biceps (C-5 to C-6) 2/4 tricep (C7) 2/4    triceps (C7) 2/4 Brachioradialis (C6) 2/4  Brachioradialis (C6) 2/4  Lower Extremity Lower Extremity  quadriceps (L-2 to L-4) 2/4   quadriceps (L-2 to L-4) 2/4 Achilles (S1) 2/4   Achilles (S1) 2/4  Plantars: Right:  downgoing   Left: downgoing Cerebellar: normal finger-to-nose,  normal heel-to-shin test    Lab Results: Basic Metabolic Panel:  Recent Labs Lab 12/11/14 1722 12/12/14 0600  NA 140 140  K 3.9 3.8  CL 107 108  CO2 24 27  GLUCOSE 112* 104*  BUN 9 9  CREATININE 0.92 0.96  CALCIUM 9.9 9.2  MG  --  2.1  PHOS  --  3.9    Liver Function Tests:  Recent Labs Lab 12/12/14 0600  AST 17  ALT 13  ALKPHOS 77  BILITOT 0.8  PROT 7.0  ALBUMIN 4.3   No results for input(s): LIPASE, AMYLASE in the last 168 hours. No results for input(s): AMMONIA in the last 168 hours.  CBC:  Recent Labs Lab 12/11/14 1722 12/12/14 0600  WBC 7.0 6.2  NEUTROABS 4.9 3.5  HGB 13.4 13.0  HCT 40.9 41.1  MCV 81.2 82.2  PLT 285 287    Cardiac Enzymes: No results for input(s): CKTOTAL, CKMB, CKMBINDEX, TROPONINI in the last 168 hours.  Lipid Panel: No results for input(s): CHOL, TRIG, HDL, CHOLHDL, VLDL, LDLCALC in the last 168 hours.  CBG: No results for input(s): GLUCAP in the last 168 hours.  Microbiology: No results found for this or any previous visit.  Coagulation Studies: No results for input(s): LABPROT, INR in the last 72 hours.  Imaging: Ct Head Wo Contrast  12/11/2014   CLINICAL DATA:  24 year old female with postural orthostatic tachycardic syndrome with history of recent  fall and injury to the head. Syncope.  EXAM: CT HEAD WITHOUT CONTRAST  TECHNIQUE: Contiguous axial images were obtained from the base of the skull through the vertex without intravenous contrast.  COMPARISON:  Head CT 12/02/2014.  FINDINGS: No acute intracranial abnormalities. Specifically, no evidence of acute intracranial hemorrhage, no definite findings of acute/subacute cerebral ischemia, no mass, mass effect, hydrocephalus or abnormal intra or extra-axial fluid collections. Visualized paranasal sinuses and mastoids are well pneumatized. No acute displaced skull fractures are identified.  IMPRESSION: 1. No  acute intracranial abnormalities. 2. The appearance of the brain is normal. 3. Previously noted left parietal scalp hematoma has resolved.   Electronically Signed   By: Trudie Reed M.D.   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 Being followed by [facility] [address] cardiology. Chief complaint of recent Syncope, fatigue. Initial encounter.  EXAM: MRI HEAD WITHOUT CONTRAST  TECHNIQUE: Multiplanar, multiecho pulse sequences of the brain and surrounding structures were obtained without intravenous contrast.  COMPARISON:  Head CT without contrast 12/11/2014 and earlier.  FINDINGS: Cerebral volume is normal. No restricted diffusion to suggest acute infarction. No midline shift, mass effect, evidence of mass lesion, ventriculomegaly, extra-axial collection or acute intracranial hemorrhage. Cervicomedullary junction and pituitary are within normal limits. Negative visualized cervical spine. Major intracranial vascular flow voids are within normal limits; dominant distal left vertebral artery suspected. Wallace Cullens and white matter signal is within normal limits throughout the brain. Mesial temporal lobe structures appear symmetric and within normal limits, including the hippocampal complexes.  Visible internal auditory structures appear normal. Mastoids are clear. Paranasal sinuses are clear. Visualized orbit soft tissues are within normal limits. Mild broad-based posterior scalp hematoma (series 3, image 11) as seen recently on CT. Other scalp soft tissues are within normal limits. Normal bone marrow signal.  IMPRESSION: 1.  Normal non contrast MRI appearance of the brain. 2. Mild broad-based posterior scalp hematoma re - identified.   Electronically Signed   By: Augusto Gamble M.D.   On: 12/12/2014 08:44    Medications:  Prior to Admission:  Prescriptions prior to admission  Medication Sig Dispense Refill Last Dose   . fludrocortisone (FLORINEF) 0.1 MG tablet Take 0.1 mg by mouth 2 (two) times daily. 4:30 and 10 PM   12/11/2014 at Unknown time  . lamoTRIgine (LAMICTAL) 100 MG tablet Take 100 mg by mouth 2 (two) times daily. 4:30 AND 10 PM   12/11/2014 at Unknown time  . metoprolol succinate (TOPROL-XL) 25 MG 24 hr tablet Take 12.5 mg by mouth daily.   12/11/2014 at 0430  . midodrine (PROAMATINE) 5 MG tablet Take 10 mg by mouth 3 (three) times daily with meals. 4:30, 11, 4   12/11/2014 at Unknown time  . lamoTRIgine (LAMICTAL) 100 MG tablet TAKE 1 TABLET (100 MG TOTAL) BY MOUTH 3 (THREE) TIMES DAILY. (Patient not taking: Reported on 12/11/2014) 90 tablet 5 Not Taking at Unknown time  . lamoTRIgine (LAMICTAL) 100 MG tablet TAKE 1 TABLET (100 MG TOTAL) BY MOUTH 3 (THREE) TIMES DAILY. (Patient not taking: Reported on 12/11/2014) 90 tablet 2 Not Taking at Unknown time   Scheduled: . fludrocortisone  0.1 mg Oral 2 times per day  . lamoTRIgine  100 mg Oral 2 times per day  . metoprolol succinate  12.5 mg Oral Daily  . midodrine  10 mg Oral 3 times per day  . sodium chloride  3  mL Intravenous Q12H    Assessment/Plan:  24 y/o with a history of recurrent syncope in the setting of POTS, brought in after sustaining an episode of LOC with unusually prolonged confusion and combativeness after regaining full consciousness. Neuro-exam is non focal and CT brain unremarkable for acute abnormality. MRI shows no abnormalities.  Based on clinical grounds I can not determine whether she had a convulsive syncope or a seizure. EEG pending Will continue current dose lamotrigine. If EEG is normal no further in hospital neurology diagnostic testing and she can follow up with neurology as out patient.   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 She is already on Lamotrigine 100 mg BID and suggested increasing the dose to 100-150 mg BID which can be further adjusted if needed as outpatient.  Advised patient to schedule outpatient follow up with her neurologist in 2-3 weeks. No driving, riding horses, swimming alone.  Wyatt Portelasvaldo Camilo, MD

## 2014-12-12 NOTE — Progress Notes (Signed)
EEG Completed; Results Pending  

## 2014-12-12 NOTE — Discharge Summary (Addendum)
Physician Discharge Summary  Jennifer Galloway ZOX:[id number] DOB: 1991/07/07 DOA: 12/11/2014  PCP: Angelica Chessman., MD  Admit date: 12/11/2014 Discharge date: 12/12/2014  Time spent: Less than 30 minutes  Recommendations for Outpatient Follow-up:  1. Dr. Rosiland Oz, Cardiology at [facility]: call for appointment to be seen next 3-5 days. 2. Dr. Bernadette Hoit, PCP 3. Dr. Ellison Carwin, Neurology in 1 week.  Discharge Diagnoses:  Principal Problem:   Syncope Active Problems:   POTS (postural orthostatic tachycardia syndrome)   ADHD (attention deficit hyperactivity disorder)   Seizure   Discharge Condition: Improved & Stable  Diet recommendation: Regular diet.  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 Ace Gins on 12/09/14 at which time she was started on metoprolol XL 12.5 MG daily for tachycardia. She did well on this medication for one day. He had advised patient to follow-up PCP regarding possible UTI as cause of tachycardia (she apparently had a UTI when she first had loop recorder placed). She did not have any urinary symptoms, fever or chills. She was empirically started on oral Cipro and took the first dose on 12/11/14. 30 minutes after taking Cipro and metoprolol, she had her first episode of syncope while standing up and brushing her teeth. As per mother, this episode felt similar to her other episodes of syncope. Same afternoon while patient was sitting and working on her computer, she had another episode of syncope followed by 4-5 minutes of confusion and agitation. As per mother, this is not usual for her syncope. By the time EMS arrived, her mental status was back at baseline. She was admitted  to the hospital due to concern for syncope versus seizures. Neurology was consulted. Neuro exam is nonfocal. CT brain and MRI brain show no acute abnormalities but does shows prior scalp hematoma sustained during a recent fall. Based on her clinical picture, it is difficult to determine if she had a convulsive syncope or seizure. EEG has been done and report is pending. As per neurology, would continue current dose of lamotrigine and follow-up with her primary cardiologist. Patient is to also continue other medications including Toprol-XL, Florinef and midodrine. EKG without acute abnormalities. In the absence of urinary symptoms, fever, leukocytosis and unimpressive urine microscopy, UTI was felt less likely and antibiotics were not continued. UDS negative. Telemetry shows sinus rhythm without arrhythmias. Orthostatic blood pressure checks were negative. Patient apparently drives. Patient and her mother have been counseled extensively that patient should not drive, ride horses which she does, operate heavy/dangerous machinery and avoid heights and standing water until she is cleared by an M.D. Both verbalized clear understanding of same. Patient uses bilateral lower extremity compression stockings and is well aware of gradual maneuvers to avoid orthostatic symptoms.  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 Discussed with Dr. Leroy Kennedy, Neurology who recommends increasing Lamictal dose by 50 mg at night.   Consultations:  Neurology  Procedures:  None    Discharge Exam:  Complaints:  Patient denies complaints. As per mother, mental status returned to baseline yesterday within 5-6 minutes after second episode of passing out. Has ambulated to the bathroom twice without dizziness, lightheadedness or sensation of passing out.  Filed Vitals:   12/12/14 0500 12/12/14 0700  12/12/14 0939 12/12/14 1415  BP:  111/69 113/69 129/78  Pulse:  92 75 78  Temp:  99.5 F (37.5 C) 98.3 F (36.8 C)  98.7 F (37.1 C)  TempSrc:  Oral Oral Axillary  Resp:  Height:      Weight: 65.6 kg (144 lb 10 oz)     SpO2:  97% 98% 98%    General exam: Pleasant young female lying comfortably supine in bed. Respiratory system: Clear. No increased work of breathing. Cardiovascular system: S1 & S2 heard, RRR. No JVD, murmurs, gallops, clicks or pedal edema. Telemetry: Sinus rhythm. Gastrointestinal system: Abdomen is nondistended, soft and nontender. Normal bowel sounds heard. Central nervous system: Alert and oriented. No focal neurological deficits. Extremities: Symmetric 5 x 5 power.  Discharge Instructions      Discharge Instructions    Call MD for:  extreme fatigue    Complete by:  As directed      Call MD for:  persistant dizziness or light-headedness    Complete by:  As directed      Call MD for:    Complete by:  As directed   Passing out or seizure like activity.     Diet general    Complete by:  As directed      Discharge instructions    Complete by:  As directed   Do not drive or ride horses or operate heavy/dangerous machinery and avoid heights and standing water until cleared by an M.D.     Driving Restrictions    Complete by:  As directed   No driving until cleared by M.D.     Increase activity slowly    Complete by:  As directed             Medication List    TAKE these medications        fludrocortisone 0.1 MG tablet  Commonly known as:  FLORINEF  Take 0.1 mg by mouth 2 (two) times daily. 4:30 and 10 PM     lamoTRIgine 100 MG tablet  Commonly known as:  LAMICTAL  Take 1-1.5 tablets (100-150 mg total) by mouth 2 (two) times daily. Take 1 tablet (100 mg) at 4:30 AM AND 1.5 tablet (150 mg) at 10 PM     metoprolol succinate 25 MG 24 hr tablet  Commonly known as:  TOPROL-XL  Take 12.5 mg by mouth daily.     midodrine 5 MG tablet  Commonly known as:  PROAMATINE  Take 10 mg by mouth 3 (three) times daily with meals. 4:30, 11, 4       Follow-up  Information    Follow up with Herma Mering, MD. Schedule an appointment as soon as possible for a visit in 3 days.   Specialty:  Pediatrics   Contact information:   [facility] [place] [address] [place] [address] [phone]       Follow up with Angelica Chessman., MD.   Specialty:  Family Medicine   Contact information:   [address] [place] [address] [phone]       Follow up with Deetta Perla, MD. Schedule an appointment as soon as possible for a visit in 1 week.   Specialty:  Pediatrics   Contact information:   [address] [place] [address] [phone]        The results of significant diagnostics from this hospitalization (including imaging, microbiology, ancillary and laboratory)  are listed below for reference.    Significant Diagnostic Studies: Ct Head Wo Contrast  12/11/2014   CLINICAL DATA:  24 year old female with postural orthostatic tachycardic syndrome with history of recent fall and injury to the head. Syncope.  EXAM: CT HEAD WITHOUT CONTRAST  TECHNIQUE: Contiguous axial images were obtained from the base of the skull through the vertex without intravenous contrast.  COMPARISON:  Head CT 12/02/2014.  FINDINGS: No acute intracranial abnormalities. Specifically, no evidence of acute intracranial hemorrhage, no definite findings of acute/subacute cerebral ischemia, no mass, mass effect, hydrocephalus or abnormal intra or extra-axial fluid collections. Visualized paranasal sinuses and mastoids are well pneumatized. No acute displaced skull fractures are identified.  IMPRESSION: 1. No acute intracranial abnormalities. 2. The appearance of the brain is normal. 3. Previously noted left parietal scalp hematoma has resolved.   Electronically Signed   By: Trudie Reed M.D.   On: 12/11/2014 19:50   Ct Head Wo Contrast  12/02/2014   CLINICAL DATA:  24 year old female with syncopal  episode today complicated by a fall with injury to the back of the head. Swelling in the occipital region. Abrasion to the scan.  EXAM: CT HEAD WITHOUT CONTRAST  CT CERVICAL SPINE WITHOUT CONTRAST  TECHNIQUE: Multidetector CT imaging of the head and cervical spine was performed following the standard protocol without intravenous contrast. Multiplanar CT image reconstructions of the cervical spine were also generated.  COMPARISON:  Head CT 11/01/2008.  FINDINGS: CT HEAD FINDINGS  Extensive soft tissue swelling in the left parieto-occipital scalp, compatible with a contusion and small hematoma. No acute displaced skull fractures are identified. No acute intracranial abnormality. Specifically, no evidence of acute post-traumatic intracranial hemorrhage, no definite regions of acute/subacute cerebral ischemia, no focal mass, mass effect, hydrocephalus or abnormal intra or extra-axial fluid collections. The visualized paranasal sinuses and mastoids are well pneumatized.  CT CERVICAL SPINE FINDINGS  Reversal of normal cervical lordosis centered at the level of C6, presumably positional. Alignment is otherwise anatomic. No acute displaced fractures are noted. Prevertebral soft tissues are normal. Mild multilevel degenerative disc disease, most severe at C4-C5. Visualized portions of the upper thorax are unremarkable.  IMPRESSION: 1. Soft tissue contusion and small scalp hematoma in the left parieto-occipital region. No underlying displaced skull fracture identified. 2. No signs of significant acute intracranial trauma. The appearance of the brain is normal. 3. No evidence of acute traumatic injury to the cervical spine.   Electronically Signed   By: Trudie Reed M.D.   On: 12/02/2014 18:54   Ct Cervical Spine Wo Contrast  12/02/2014   CLINICAL DATA:  24 year old female with syncopal episode today complicated by a fall with injury to the back of the head. Swelling in the occipital region. Abrasion to the scan.  EXAM:  CT HEAD WITHOUT CONTRAST  CT CERVICAL SPINE WITHOUT CONTRAST  TECHNIQUE: Multidetector CT imaging of the head and cervical spine was performed following the standard protocol without intravenous contrast. Multiplanar CT image reconstructions of the cervical spine were also generated.  COMPARISON:  Head CT 11/01/2008.  FINDINGS: CT HEAD FINDINGS  Extensive soft tissue swelling in the left parieto-occipital scalp, compatible with a contusion and small hematoma. No acute displaced skull fractures are identified. No acute intracranial abnormality. Specifically, no evidence of acute post-traumatic intracranial hemorrhage, no definite regions of acute/subacute cerebral ischemia, no focal mass, mass effect, hydrocephalus or abnormal intra or extra-axial fluid collections. The visualized paranasal sinuses and mastoids are well pneumatized.  CT CERVICAL SPINE FINDINGS  Reversal  of normal cervical lordosis centered at the level of C6, presumably positional. Alignment is otherwise anatomic. No acute displaced fractures are noted. Prevertebral soft tissues are normal. Mild multilevel degenerative disc disease, most severe at C4-C5. Visualized portions of the upper thorax are unremarkable.  IMPRESSION: 1. Soft tissue contusion and small scalp hematoma in the left parieto-occipital region. No underlying displaced skull fracture identified. 2. No signs of significant acute intracranial trauma. The appearance of the brain is normal. 3. No evidence of acute traumatic injury to the cervical spine.   Electronically Signed   By: Trudie Reed M.D.   On: 12/02/2014 18:54   Mr Brain Wo Contrast  12/12/2014   CLINICAL DATA:  24 year old female with current history of POT syndrome, photosensitive reflex epilepsy. Being followed by [facility] cardiology. Chief complaint of recent Syncope, fatigue. Initial encounter.  EXAM: MRI HEAD WITHOUT CONTRAST  TECHNIQUE: Multiplanar, multiecho pulse sequences of the brain and surrounding structures were  obtained without intravenous contrast.  COMPARISON:  Head CT without contrast 12/11/2014 and earlier.  FINDINGS: Cerebral volume is normal. No restricted diffusion to suggest acute infarction. No midline shift, mass effect, evidence of mass lesion, ventriculomegaly, extra-axial collection or acute intracranial hemorrhage. Cervicomedullary junction and pituitary are within normal limits. Negative visualized cervical spine. Major intracranial vascular flow voids are within normal limits; dominant distal left vertebral artery suspected. Wallace Cullens and white matter signal is within normal limits throughout the brain. Mesial temporal lobe structures appear symmetric and within normal limits, including the hippocampal complexes.  Visible internal auditory structures appear normal. Mastoids are clear. Paranasal sinuses are clear. Visualized orbit soft tissues are within normal limits. Mild broad-based posterior scalp hematoma (series 3, image 11) as seen recently on CT. Other scalp soft tissues are within normal limits. Normal bone marrow signal.  IMPRESSION: 1.  Normal non contrast MRI appearance of the brain. 2. Mild broad-based posterior scalp hematoma re - identified.   Electronically Signed   By: Augusto Gamble M.D.   On: 12/12/2014 08:44    Microbiology: No results found for this or any previous visit (from the past 240 hour(s)).   Labs: Basic Metabolic Panel:  Recent Labs Lab 12/11/14 1722 12/12/14 0600  NA 140 140  K 3.9 3.8  CL 107 108  CO2 24 27  GLUCOSE 112* 104*  BUN 9 9  CREATININE 0.92 0.96  CALCIUM 9.9 9.2  MG  --  2.1  PHOS  --  3.9   Liver Function Tests:  Recent Labs Lab 12/12/14 0600  AST 17  ALT 13  ALKPHOS 77  BILITOT 0.8  PROT 7.0  ALBUMIN 4.3   No results for input(s): LIPASE, AMYLASE in the last 168 hours. No results for input(s): AMMONIA in the last 168 hours. CBC:  Recent Labs Lab 12/11/14 1722 12/12/14 0600  WBC 7.0 6.2  NEUTROABS 4.9 3.5  HGB 13.4 13.0  HCT  40.9 41.1  MCV 81.2 82.2  PLT 285 287   Cardiac Enzymes: No results for input(s): CKTOTAL, CKMB, CKMBINDEX, TROPONINI in the last 168 hours. BNP: BNP (last 3 results) No results for input(s): BNP in the last 8760 hours.  ProBNP (last 3 results) No results for input(s): PROBNP in the last 8760 hours.  CBG: No results for input(s): GLUCAP in the last 168 hours.   Additional labs: 1. Urine pregnancy test: Negative. 2. UDS: Negative  Discussed at length with patient's mother. Updated care and answered questions.  Signed:  Marcellus Scott, MD, FACP, FHM. [facility] Pager  [phone]  If 7PM-7AM, please contact night-coverage [email or web address] Password [email or web address] 12/12/2014, 4:54 PM

## 2014-12-12 NOTE — Procedures (Signed)
ELECTROENCEPHALOGRAM REPORT   Patient: Jennifer Galloway       Room #: [id number] EEG No. ID: [id number] Age: 24 y.o.        Sex: female Referring Physician: Hongalgi Report Date:  12/12/2014        Interpreting Physician: Thana FarrEYNOLDS, Lakya Schrupp  History: Jennifer Galloway is an 24 y.o. female admitted after recurrent syncopal events.  Has a history of seizures.    Medications:  Scheduled: . fludrocortisone  0.1 mg Oral 2 times per day  . lamoTRIgine  100 mg Oral 2 times per day  . metoprolol succinate  12.5 mg Oral Daily  . midodrine  10 mg Oral 3 times per day  . sodium chloride  3 mL Intravenous Q12H    Conditions of Recording:  This is a 16 channel EEG carried out with the patient in the awake and drowsy states.  Description:  The waking background activity consists of a low voltage, symmetrical, fairly well organized, 11 Hz alpha activity, seen from the parieto-occipital and posterior temporal regions.  Low voltage fast activity, poorly organized, is seen anteriorly and is at times superimposed on more posterior regions.  A mixture of theta and alpha rhythms are seen from the central and temporal regions. The patient drowses with slowing to irregular, low voltage theta and beta activity.   Stage II sleep is not obtained. Frequently during the recording is noted bursts of polyspike, spike and slow wave activity.  At times this activity is more prominent from the right hemisphere and at other times it is more generalized.  There is a fronto-central predominance.   Hyperventilation was not performed.  Intermittent photic stimulation was performed but failed to illicit any change in the tracing.    IMPRESSION: This is an abnormal electroencephalogram secondary to bursts of generalized and right hemispheric polyspike, spike and slow wave activity.  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    Results discussed  with Dr. Carmon GinsbergHongalgi   Jennifer Bachmeier, MD [facility] [phone] 12/12/2014, 5:44 PM

## 2014-12-16 ENCOUNTER — Telehealth: Payer: Self-pay | Admitting: Family

## 2014-12-16 NOTE — Telephone Encounter (Addendum)
Noted, it is a bizarre time to take one's medication.  Perhaps she should get her level at the end of the work day which will more closely approximate a trough level that one taken 4 1/2 hours after she takes her medication.  It looks like se said that she was not taking her lamotrigine at the ED visit on January 26.  I also need to discuss the lack of communication between Dr. Georga Hackingsvaldo and our office.

## 2014-12-16 NOTE — Telephone Encounter (Signed)
Mom Norberto SorensonSusan Friedel left message about Jennifer Galloway. She said that Jennifer Galloway was admitted to hospital last week with what Mom says turned out to be drug reaction that caused problems with POTS, and Lamictal dose was increased. Mom said that [facility]allow[facility]. She began having syncopal episodes with questionable seizure activity, was admitted to hospital. Mom said that she had full evaluation, including CT and EEG. She said that cardiology felt that she was reacting to Cipro as it can lower BP. At the same time, there was concern for possible seizure, so neurology was consulted and she had EEG, which was abnormal but "expected to be abnormal, showing spikes of activity" per Mom and that the Lamictal dose was increased by 50mg . Mom said that in follow up with cardiology, he recommended that she withdraw from school for this semester, and she is in process of doing that. Mom asked for follow up here, and I offered her 8:15AM on 12/19/14 with Dr Sharene SkeansHickling. Mom declined that saying that was too early, and accepted appointment with me on 12/24/14 at 12N. I will consult with Dr Sharene SkeansHickling at that visit. Mom also said that they were told to have her Lamictal level checked and she has a plan for that late this week. I explained how to obtain a trough level and Mom was concerned about that since Jennifer Galloway takes her medication at 4:30AM every day, and won't be able to go to the lab until 9AM or so. I explained why we wanted trough level and Mom agreed to have blood drawn in this manner. TG

## 2014-12-24 ENCOUNTER — Ambulatory Visit (INDEPENDENT_AMBULATORY_CARE_PROVIDER_SITE_OTHER): Payer: Managed Care, Other (non HMO) | Admitting: Family

## 2014-12-24 ENCOUNTER — Encounter: Payer: Self-pay | Admitting: Family

## 2014-12-24 VITALS — BP 120/70 | HR 82 | Ht 70.0 in | Wt 152.4 lb

## 2014-12-24 DIAGNOSIS — G40802 Other epilepsy, not intractable, without status epilepticus: Secondary | ICD-10-CM

## 2014-12-24 DIAGNOSIS — G90A Postural orthostatic tachycardia syndrome (POTS): Secondary | ICD-10-CM

## 2014-12-24 DIAGNOSIS — G40309 Generalized idiopathic epilepsy and epileptic syndromes, not intractable, without status epilepticus: Secondary | ICD-10-CM | POA: Diagnosis not present

## 2014-12-24 DIAGNOSIS — I951 Orthostatic hypotension: Secondary | ICD-10-CM | POA: Diagnosis not present

## 2014-12-24 DIAGNOSIS — R000 Tachycardia, unspecified: Secondary | ICD-10-CM | POA: Diagnosis not present

## 2014-12-24 NOTE — Progress Notes (Signed)
Patient: Jennifer Galloway MRN: [id number] Sex: female DOB: July 16, 1991  Provider: Elveria Galloway, Jennifer Wickliff, NP [facility]  Note type: Routine return visit  History of Present Illness: Referral Source: Dr. Rosiland OzScott Galloway History from: patient and her mother Chief Complaint: POTS/Hospital Follow Up  Jennifer Galloway is a 24 y.o. woman with history of Postural Orthostatic Tachycardia Syndrome (POTS) and a photic sensitive seizure disorder. She is followed by cardiology and takes Toprol-XL, Florinef and Midodrine for treatment of the POTS. She had a recent hospitalization because of flare up of symptoms related to taking Cipro for a presumed urinary tract infection. Because of her time out of school as well as time to recover from the episode, she had to withdraw from college classes this semester. [place] leaving to go there on Friday to rest for a few weeks. She says that since the hospitalization, she has been wearing compression stockings, is taking iron and has been feeling better.   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 EEG has shown evidence of photo myoclonic or photo convulsive responses are generalized spike and slow wave activity associated with photic stimulation, but she has Galloway had clinical seizures. The last occurred on November 02, 2009. Jennifer Galloway has been seizure free on Lamotrigine since that time. EEG June 03, 2006 showed polyspike and slow-wave discharges with photic stimulation. This was similar to EEG done August 09, 2002 which showed 3-1/2-4-1/2 Hz spike and slow wave complexes during photic stimulation. She drives without restrictions and is not interested in coming off medication, so repeat EEG's for the purpose of tapering medication have not been performed. Her mother tells me that she is unable to use her laptop computer at this time because of the refresh time on the screen  potentially triggering seizures. They are shopping for a new laptop for her.   Review of Systems: 12 system review was unremarkable  Past Medical History  Diagnosis Date  . POTS (postural orthostatic tachycardia syndrome)   . Photosensitive reflex epilepsy   . ADHD (attention deficit hyperactivity disorder)   . Seizures   . Orthostatic hypotension dysautonomic syndrome    Hospitalizations: Yes.  , Head Injury: No., Nervous System Infections: No., Immunizations up to date: Yes.   Past Medical History Comments: Hospitalized overnight due to drug interaction.  Surgical History Past Surgical History  Procedure Laterality Date  . No past surgeries      Family History family history includes Cancer in her paternal grandfather; Seizures in her mother; Thyroid disease in her mother. Family History is otherwise negative for migraines, seizures, cognitive impairment, blindness, deafness, birth defects, chromosomal disorder, autism.  Social History History   Social History  . Marital Status: Single    Spouse Name: N/A  . Number of Children: N/A  . Years of Education: N/A   Social History Main Topics  . Smoking status: Never Smoker   . Smokeless tobacco: Never Used  . Alcohol Use: No  . Drug Use: No  . Sexual Activity: No   Other Topics Concern  . 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   Physical Exam BP 120/70 mmHg  Pulse  82  Ht  (1.778 m)  Wt 152 lb 6.4 oz (69.128 kg)  BMI 21.87 kg/m2  LMP 10/08/2014 (Exact Date) General: well developed, well nourished young woman, seated in exam room, in no evident distress Head: head normocephalic and atraumatic. Oropharynx benign. Neck: supple with no  carotid or supraclavicular bruits Cardiovascular: regular rate and rhythm, no murmurs Skin: No rashes or lesions  Neurologic Exam Mental Status: Awake and fully alert. Oriented to place and time. Recent and remote memory intact. Attention span, concentration, and fund of knowledge appropriate. Mood and affect appropriate. Cranial Nerves: Fundoscopic exam revels sharp disc margins. Pupils equal, briskly reactive to light. Extraocular movements full without nystagmus. Visual fields full to confrontation. Hearing intact and symmetric to finger rub. Facial sensation intact. Face tongue, palate move normally and symmetrically. Neck flexion and extension normal. Motor: Normal bulk and tone. Normal strength in all tested extremity muscles. Sensory: Intact to touch and temperature in all extremities.  Coordination: Rapid alternating movements normal in all extremities. Finger-to-nose and heel-to shin performed accurately bilaterally. Romberg negative. Gait and Station: Arises from chair without difficulty. Stance is normal. Gait demonstrates normal stride length and balance. Able to heel, toe and tandem walk without difficulty. Reflexes: diminished and symmetric. Toes downgoing.  Assessment and Plan Jennifer Galloway is a 24 year old young woman with history of Postural Orthostatic Tachycardia Syndrome (POTS) and a photic sensitive seizure disorder. She has been seizure free on Lamotrigine since December, 2010. Jennifer Galloway drives without restrictions and is not interested in tapering off medication. She had a recent flare up of POTS related to taking Cipro and is still recovering from that. She withdraw from college classes this semester and will return to school in the summer. I will see Jennifer Galloway in 1 year or sooner if needed. She and her mother agreed with this plan.

## 2014-12-24 NOTE — Patient Instructions (Signed)
Continue your medications without change.  Please plan to return for follow up in 1 year or sooner if needed.

## 2014-12-27 NOTE — Telephone Encounter (Signed)
[facility]. The Lamotrigine level is 6.624mcg/ml. I called Mom and told her that it was a therapeutic level and for her to continue the same dose of medication. Mom agreed with this plan. TG

## 2015-04-24 ENCOUNTER — Other Ambulatory Visit: Payer: Self-pay | Admitting: Family

## 2015-06-09 ENCOUNTER — Telehealth: Payer: Self-pay | Admitting: Family

## 2015-06-09 NOTE — Telephone Encounter (Signed)
When I talked with Mom she said that Jennifer Galloway was hydrating well, had been working out, Catering manager and that she had seen her cardiologist recently and he was very pleased with how she was doing. I will remind Mom about the fluid intake when I talk with her about the Lamictal lab result. TG

## 2015-06-09 NOTE — Telephone Encounter (Signed)
I agree with your plan.  I think this is more likely a syncopal event and coincidental.  When you speak to her again make certain that she is hydrating herself well.

## 2015-06-09 NOTE — Telephone Encounter (Addendum)
Jennifer Galloway Jennifer Galloway left message about Jennifer Galloway. She said that on Friday July 29th, Jennifer Galloway received a Depo injection. On Sunday, she was helping Jennifer Galloway in the store when she suddenly sat down on the floor. Jennifer Galloway asked her what was wrong and she said that she didn't know but that she felt dizzy. Jennifer Galloway thinks that she had a near syncope but was concerned about seizure as well. Jennifer Galloway wanted to know if Depo Provera would affect Jennifer Galloway. I told her that oral contraceptives could lower the Jennifer Galloway level and that it was possible for the Depo to do it as well as it is a hormone preparation. I recommended that Jennifer Galloway have her Jennifer Galloway level checked and Jennifer Galloway agreed. She said that Jennifer Galloway was in Jennifer Galloway so she would arrange to have it done there. She knows that the blood needs to be drawn as a trough level. Jennifer Galloway has also contacted [facility] cardiologist about the probable syncopal spell. I asked her to have a copy of the Jennifer Galloway level sent to this office and she agreed. She also verified the Jennifer Galloway dose but said that she thinks Jennifer Galloway has been taking 1AM and 1+1/2 PM.  I told her that Jennifer Galloway's last instructions was to take either 1 TID (or 1+1/2 BID). Jennifer Galloway will review this with Jennifer Galloway and verify dose. I told Jennifer Galloway that I will call her to review the lab report when it is available to me. Jennifer Galloway had no further questions at this time. TG

## 2015-07-15 ENCOUNTER — Telehealth: Payer: Self-pay | Admitting: *Deleted

## 2015-07-15 NOTE — Telephone Encounter (Signed)
Jennifer Galloway called and left a voicemail stating that she will be attending [employer] and needs to speak to you further about documentation she needs for her to be able to get privileges for her disabilities. She states that they are not aware of her photo sensitivity and what it can cause her. She states you can call her back at  [phone] or call her mom if she is not able to be reached.

## 2015-07-15 NOTE — Telephone Encounter (Signed)
I called and talked to Jennifer Galloway. She needs a letter sent to [facility] fax [phone] Office of Accessibility Resources regarding her photosensitivity. I told her that I will send the letter. TG

## 2015-07-17 NOTE — Telephone Encounter (Signed)
I faxed the letter to [facility] as requested. TG

## 2015-10-30 ENCOUNTER — Other Ambulatory Visit: Payer: Self-pay | Admitting: Family

## 2016-01-31 ENCOUNTER — Other Ambulatory Visit: Payer: Self-pay | Admitting: Family

## 2016-02-13 ENCOUNTER — Encounter: Payer: Self-pay | Admitting: Family

## 2016-02-29 ENCOUNTER — Other Ambulatory Visit: Payer: Self-pay | Admitting: Family

## 2016-03-01 ENCOUNTER — Encounter: Payer: Self-pay | Admitting: Family

## 2016-03-06 ENCOUNTER — Other Ambulatory Visit: Payer: Self-pay | Admitting: Family

## 2016-03-12 ENCOUNTER — Telehealth: Payer: Self-pay

## 2016-03-12 NOTE — Telephone Encounter (Signed)
Patient lvm stating that she picked up her refill from the pharmacy and was informed that it is time to schedule a f/u visit in our office. She said that she is doing well and can be reached to schedule a f/u at: [phone]. I called patient and scheduled her for 03-16-16. [place] leaving for [place] and staying there for a few months.

## 2016-03-16 ENCOUNTER — Ambulatory Visit (INDEPENDENT_AMBULATORY_CARE_PROVIDER_SITE_OTHER): Payer: 59 | Admitting: Family

## 2016-03-16 ENCOUNTER — Encounter: Payer: Self-pay | Admitting: Family

## 2016-03-16 VITALS — BP 114/70 | HR 80 | Ht 70.0 in | Wt 164.4 lb

## 2016-03-16 DIAGNOSIS — G40802 Other epilepsy, not intractable, without status epilepticus: Secondary | ICD-10-CM

## 2016-03-16 DIAGNOSIS — G40309 Generalized idiopathic epilepsy and epileptic syndromes, not intractable, without status epilepticus: Secondary | ICD-10-CM

## 2016-03-16 MED ORDER — LAMOTRIGINE 100 MG PO TABS: 100.0000 mg | ORAL_TABLET | Freq: Three times a day (TID) | ORAL | Status: DC

## 2016-03-16 NOTE — Patient Instructions (Signed)
Continue taking Lamotrigine as you have been taking it. Let me know if you have any seizures or any concerns.   I will see you back in follow up in 1 year or sooner if needed.

## 2016-03-16 NOTE — Progress Notes (Signed)
Patient: Jennifer Galloway MRN: [id number] Sex: female DOB: 19-Oct-1991  Provider: Elveria Rising, NP Location of Care: [facility]  Note type: Routine return visit  History of Present Illness: Referral Source: Dr. Rosiland Oz History from: patient, referring office and [facility] chart Chief Complaint: Epilepsy  Jennifer Galloway is a 25 y.o. young woman with history of a photo sensitive seizure disorder and Postural Orthostatic Tachycardia Syndrome (POTS). She was last seen December 24, 2014 and returns today because she needs refills on Lamotrigine. She is followed by cardiology and takes Toprol-XL, Florinef and Midodrine for treatment of the POTS. Jennifer Galloway also has a photic sensitive seizure disorder. EEG has shown evidence of photo myoclonic or photo convulsive responses are generalized spike and slow wave activity associated with photic stimulation, but she has also had clinical seizures. The last occurred on November 02, 2009. Spirit has been seizure free on Lamotrigine since that time. EEG June 03, 2006 showed polyspike and slow-wave discharges with photic stimulation. This was similar to EEG done August 09, 2002 which showed 3-1/2-4-1/2 Hz spike and slow wave complexes during photic stimulation. She drives without restrictions and is not interested in coming off medication, so repeat EEGs for the purpose of tapering medication have not been performed.   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 Jennifer Galloway says that she has been doing well since her last visit. She decided to stop receiving Depo-Provera because she has experiencing excessive menstrual bleeding with it, and says that she feels better off this medication. Jennifer Galloway has no other health concerns today other than previously mentioned.   Review of Systems: Please see the HPI for  neurologic and other pertinent review of systems. Otherwise, the following systems are noncontributory including constitutional, eyes, ears, nose and throat, cardiovascular, respiratory, gastrointestinal, genitourinary, musculoskeletal, skin, endocrine, hematologic/lymph, allergic/immunologic and psychiatric.   Past Medical History  Diagnosis Date  . POTS (postural orthostatic tachycardia syndrome)   . Photosensitive reflex epilepsy (HCC)   . ADHD (attention deficit hyperactivity disorder)   . Seizures (HCC)   . Orthostatic hypotension dysautonomic syndrome (HCC)    Hospitalizations: No., Head Injury: No., Nervous System Infections: No., Immunizations up to date: Yes.   Past Medical History Comments: See history  Surgical History Past Surgical History  Procedure Laterality Date  . No past surgeries      Family History family history includes Cancer in her paternal grandfather; Seizures in her mother; Thyroid disease in her mother. Family History is otherwise negative for migraines, seizures, cognitive impairment, blindness, deafness, birth defects, chromosomal disorder, autism.  Social History Social History   Social History  . Marital Status: Single    Spouse Name: N/A  . Number of Children: N/A  . Years of Education: N/A   Social History Main Topics  . Smoking status: Never Smoker   . Smokeless tobacco: Never Used  . Alcohol Use: No  . Drug Use: No  . Sexual Activity: No   Other Topics Concern  . None   Social History Narrative   Jennifer Galloway is attending her final year of college at [employer]. She is majoring in Engineer, water.    Lives with her parents. She has adult siblings that do not live in the home.        Allergies Allergies  Allergen Reactions  . Ciprocinonide [Fluocinolone] Other (See Comments)  Pass Out  . Dye Fdc Red [Red Dye] Other (See Comments) and Nausea And Vomiting    Allergy to Lamictal dye Allergy to Lamictal dye  . Other Other (See  Comments) and Nausea And Vomiting    Allergy to artificial sweetners Allergy to artificial sweetners  . Aspartame Nausea And Vomiting    Physical Exam BP 114/70 mmHg  Pulse 80  Ht 5\' 10"  (1.778 m)  Wt 164 lb 6.4 oz (74.571 kg)  BMI 23.59 kg/m2  LMP   General: well developed, well nourished young woman, seated in exam room, in no evident distress Head: head normocephalic and atraumatic. Oropharynx benign. Neck: supple with no carotid or supraclavicular bruits Cardiovascular: regular rate and rhythm, no murmurs Skin: No rashes or lesions  Neurologic Exam Mental Status: Awake and fully alert. Oriented to place and time. Recent and remote memory intact. Attention span, concentration, and fund of knowledge appropriate. Mood and affect appropriate. Cranial Nerves: Fundoscopic exam revels sharp disc margins. Pupils equal, briskly reactive to light. Extraocular movements full without nystagmus. Visual fields full to confrontation. Hearing intact and symmetric to finger rub. Facial sensation intact. Face tongue, palate move normally and symmetrically. Neck flexion and extension normal. Motor: Normal bulk and tone. Normal strength in all tested extremity muscles. Sensory: Intact to touch and temperature in all extremities.  Coordination: Rapid alternating movements normal in all extremities. Finger-to-nose and heel-to shin performed accurately bilaterally. Romberg negative. Gait and Station: Arises from chair without difficulty. Stance is normal. Gait demonstrates normal stride length and balance. Able to heel, toe and tandem walk without difficulty. Reflexes: diminished and symmetric. Toes downgoing.  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 Postural orthostatic tachycardia syndrome   Recommendations for plan of care The [facility]. Lanora Manislizabeth has neither had nor required imaging or lab studies since the  last visit. She is a 25 year old young woman with history of a seizure disorder and Postural Orthostatic Tachycardia Syndrome (POTS). She is followed by cardiology for POTS. She has been seizure free on Lamotrigine since December, 2010. Lanora Manislizabeth drives without restrictions and is not interested in tapering off medication. She will continue on her medication without change for now.  I will see Lanora Manislizabeth in 1 year or sooner if needed. [name] agreed with this plan.  The medication list was reviewed and reconciled.  No changes were made in the prescribed medications today.  A complete medication list was provided to the patient/caregiver.    Medication List       This list is accurate as of: 03/16/16  4:24 PM.  Always use your most recent med list.               fludrocortisone 0.1 MG tablet  Commonly known as:  FLORINEF  Take 0.1 mg by mouth 2 (two) times daily. 4:30 and 10 PM     lamoTRIgine 100 MG tablet  Commonly known as:  LAMICTAL  Take 1 tablet (100 mg total) by mouth 3 (three) times daily.     metoprolol succinate 25 MG 24 hr tablet  Commonly known as:  TOPROL-XL  Take 25 mg by mouth.     midodrine 5 MG tablet  Commonly known as:  PROAMATINE  Take 10 mg by mouth 3 (three) times daily with meals. 4:30, 11, 4     promethazine 12.5 MG tablet  Commonly known as:  PHENERGAN  Take 12.5 mg by mouth every 6 (six) hours as needed.  Total time spent with the patient was 20 minutes, of which 50% or more was spent in counseling and coordination of care.   Elveria Rising

## 2016-04-02 ENCOUNTER — Other Ambulatory Visit: Payer: Self-pay | Admitting: Family

## 2016-07-03 IMAGING — CT CT HEAD W/O CM
1 series · 16 of 29 positions shown, 20 images · non-contrast
Comparison: Head CT 12/02/2014.

CLINICAL DATA: 23-year-old female with postural orthostatic
tachycardic syndrome with history of recent fall and injury to the
head. Syncope.

EXAM:
CT HEAD WITHOUT CONTRAST
TECHNIQUE: Contiguous axial images were obtained from the base of the skull
through the vertex without intravenous contrast.

[Series 2: head 5.0 h30s · axial · 0.41mm/px · z∈[+349,+479]mm · 16 of 29 slices shown, 20 images]
[im 2/29  brain]
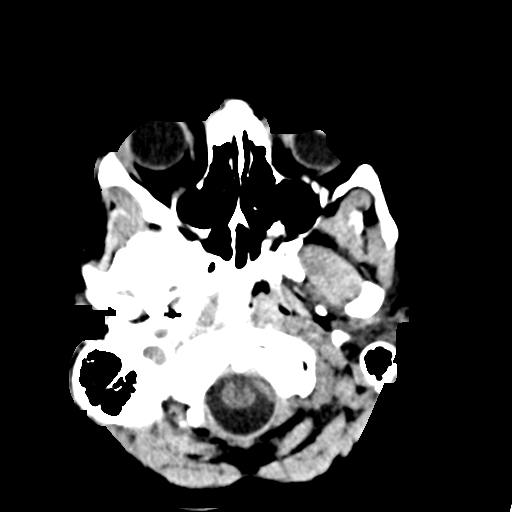
[im 2/29  bone]
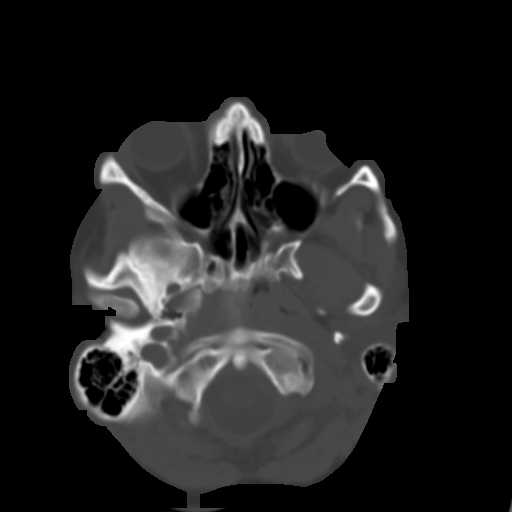
[im 4/29  brain]
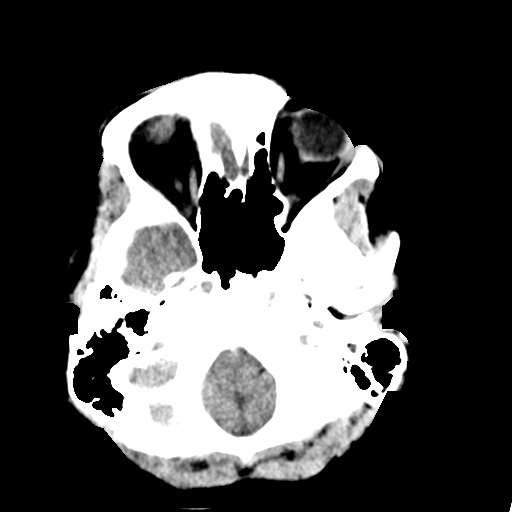
[im 6/29  brain]
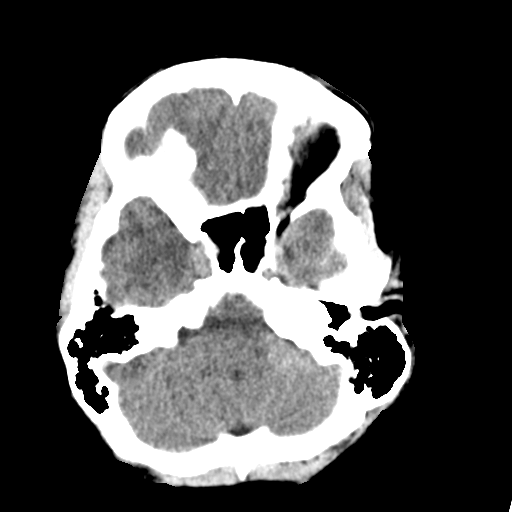
[im 7/29  brain]
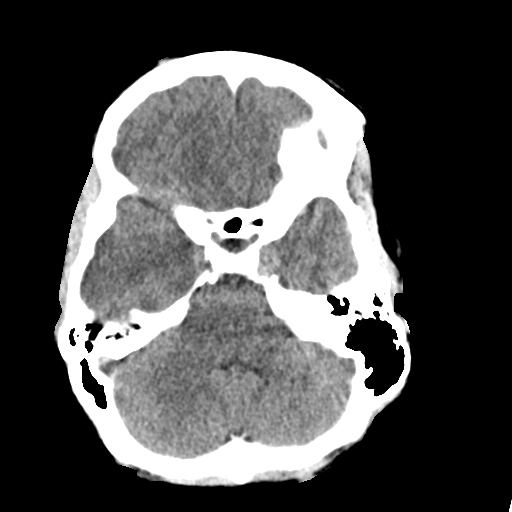
[im 9/29  brain]
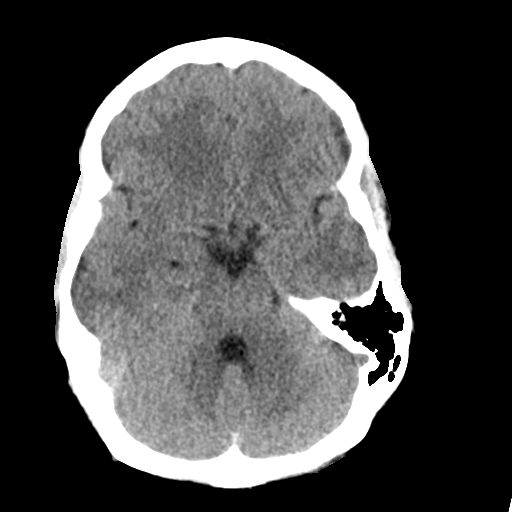
[im 9/29  bone]
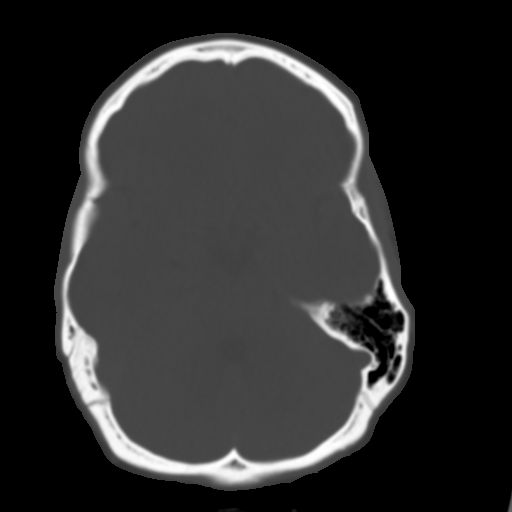
[im 11/29  brain]
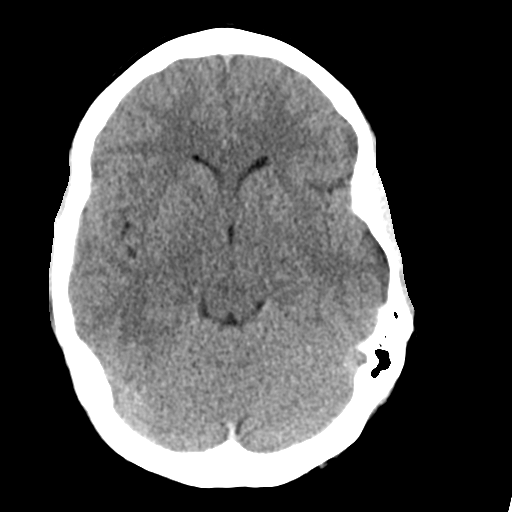
[im 12/29  brain]
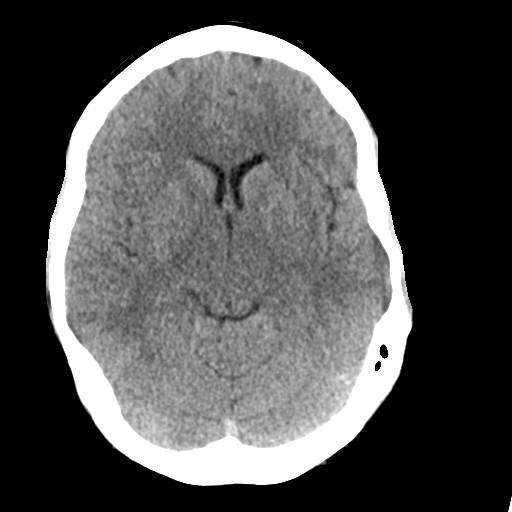
[im 14/29  brain]
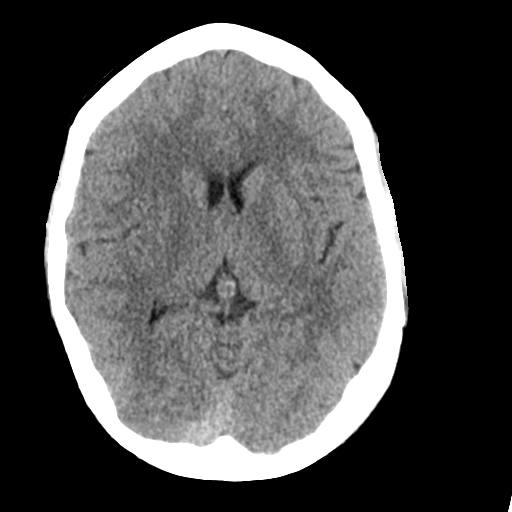
[im 16/29  brain]
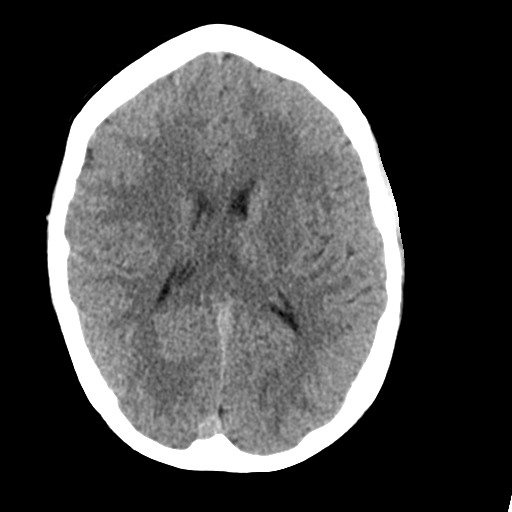
[im 16/29  bone]
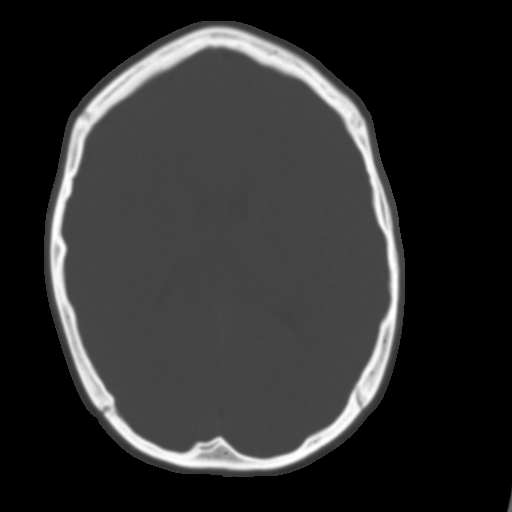
[im 18/29  brain]
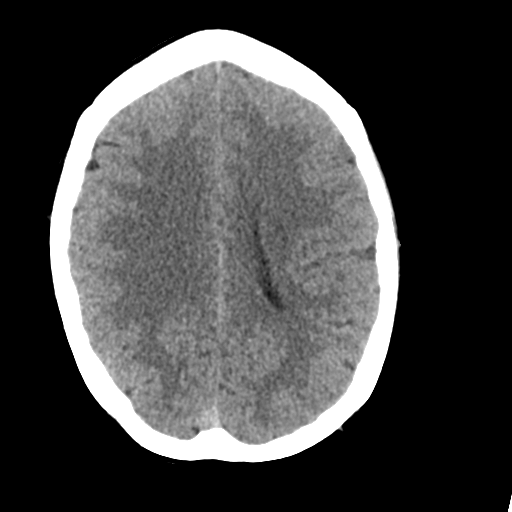
[im 19/29  brain]
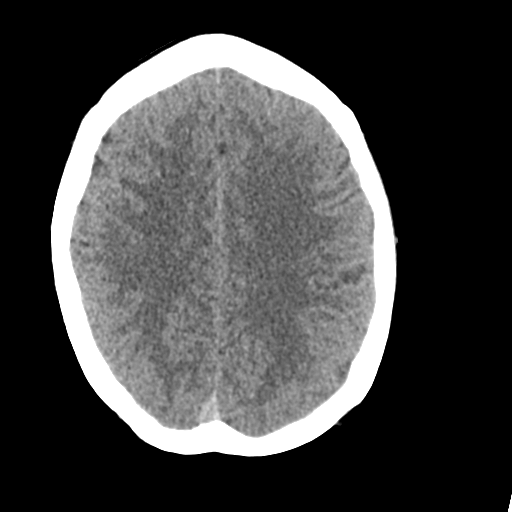
[im 21/29  brain]
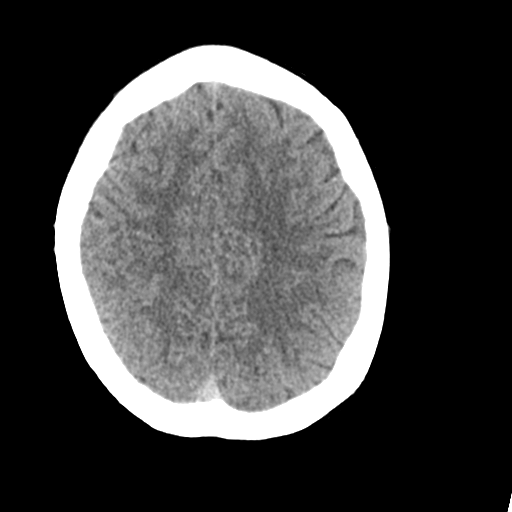
[im 23/29  brain]
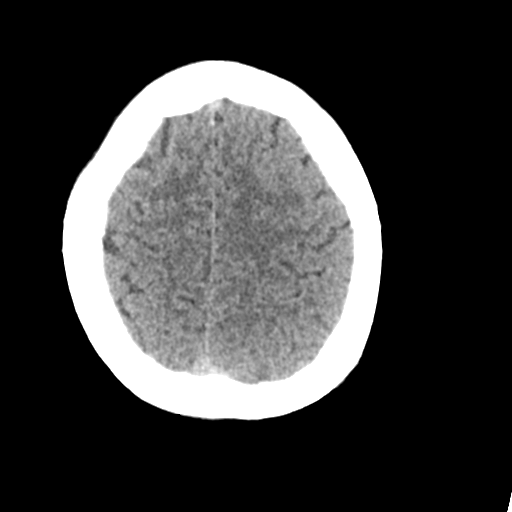
[im 23/29  bone]
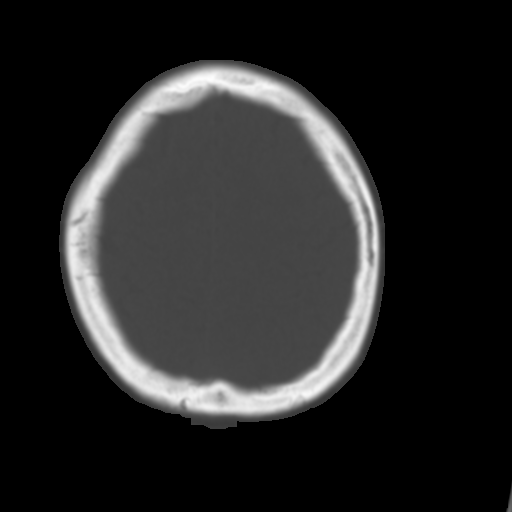
[im 24/29  brain]
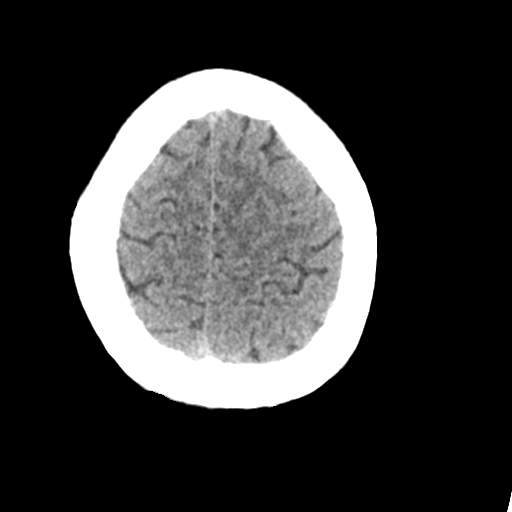
[im 26/29  brain]
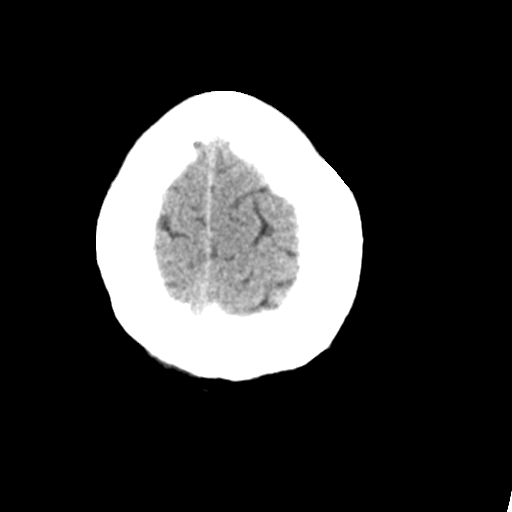
[im 28/29  brain]
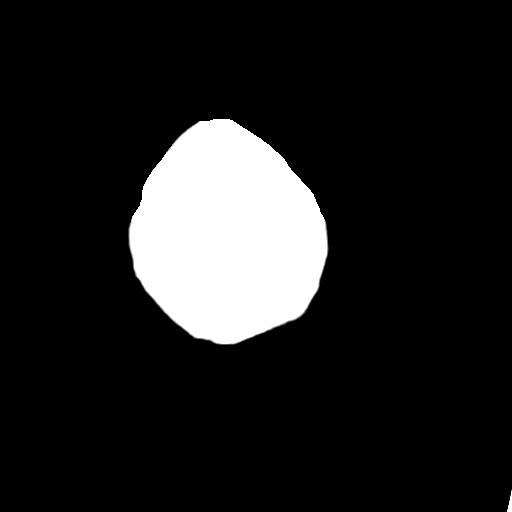

[16 of 29 positions shown; findings below may reference images not displayed]

FINDINGS: No acute intracranial abnormalities. Specifically, no evidence of
acute intracranial hemorrhage, no definite findings of
acute/subacute cerebral ischemia, no mass, mass effect,
hydrocephalus or abnormal intra or extra-axial fluid collections.
Visualized paranasal sinuses and mastoids are well pneumatized. No
acute displaced skull fractures are identified.
IMPRESSION: 1. No acute intracranial abnormalities.
2. The appearance of the brain is normal.
3. Previously noted left parietal scalp hematoma has resolved.

## 2016-09-13 ENCOUNTER — Other Ambulatory Visit: Payer: Self-pay | Admitting: Family

## 2016-12-16 DIAGNOSIS — N750 Cyst of Bartholin's gland: Secondary | ICD-10-CM | POA: Insufficient documentation

## 2017-01-01 ENCOUNTER — Other Ambulatory Visit: Payer: Self-pay | Admitting: Family

## 2017-02-27 ENCOUNTER — Other Ambulatory Visit: Payer: Self-pay | Admitting: Family

## 2017-03-31 ENCOUNTER — Other Ambulatory Visit (INDEPENDENT_AMBULATORY_CARE_PROVIDER_SITE_OTHER): Payer: Self-pay | Admitting: Family

## 2017-03-31 ENCOUNTER — Other Ambulatory Visit: Payer: Self-pay | Admitting: Family

## 2017-04-05 ENCOUNTER — Encounter (INDEPENDENT_AMBULATORY_CARE_PROVIDER_SITE_OTHER): Payer: Self-pay | Admitting: Family

## 2017-04-05 ENCOUNTER — Ambulatory Visit (INDEPENDENT_AMBULATORY_CARE_PROVIDER_SITE_OTHER): Payer: 59 | Admitting: Family

## 2017-04-05 VITALS — BP 110/70 | HR 74 | Ht 70.28 in | Wt 153.6 lb

## 2017-04-05 DIAGNOSIS — G40802 Other epilepsy, not intractable, without status epilepticus: Secondary | ICD-10-CM | POA: Diagnosis not present

## 2017-04-05 DIAGNOSIS — I951 Orthostatic hypotension: Secondary | ICD-10-CM

## 2017-04-05 DIAGNOSIS — R55 Syncope and collapse: Secondary | ICD-10-CM

## 2017-04-05 DIAGNOSIS — G40309 Generalized idiopathic epilepsy and epileptic syndromes, not intractable, without status epilepticus: Secondary | ICD-10-CM | POA: Diagnosis not present

## 2017-04-05 DIAGNOSIS — R000 Tachycardia, unspecified: Secondary | ICD-10-CM | POA: Diagnosis not present

## 2017-04-05 DIAGNOSIS — G90A Postural orthostatic tachycardia syndrome (POTS): Secondary | ICD-10-CM

## 2017-04-05 MED ORDER — LAMOTRIGINE 100 MG PO TABS: 100.0000 mg | ORAL_TABLET | Freq: Three times a day (TID) | ORAL | 5 refills | Status: DC

## 2017-04-05 NOTE — Patient Instructions (Signed)
Continue taking Lamotrigine as you have been taking it. Let me know if you have any seizures or any other concerns.   I will see you back in follow up in 1 year or sooner if needed.

## 2017-04-05 NOTE — Progress Notes (Signed)
Patient: Jennifer Galloway MRN: [id number] Sex: female DOB: May 07, 1991  Provider: Elveria Rising, NP Location of Care: [facility] Child [facility]rology  Note type: Routine return visit  History of Present Illness: Referral Source: Jennifer Galloway History from: patient Chief Complaint:  Follow up   Jennifer Galloway is a 26 y.o. young woman with history of  a photo sensitive seizure disorder and Postural Orthostatic Tachycardia Syndrome (POTS). She was last seen Mar 16, 2016 and returns today for follow up. She is followed by cardiology and takes Toprol-XL, Florinef and Midodrine for treatment of the POTS. Jennifer Galloway also has a photic sensitive seizure disorder. EEG has shown evidence of photo myoclonic or photo convulsive responses are generalized spike and slow wave activity associated with photic stimulation, but she has also had clinical seizures. The last occurred on November 02, 2009. Jennifer Galloway has been seizure free on Lamotrigine since that time. EEG June 03, 2006 showed polyspike and slow-wave discharges with photic stimulation. This was similar to EEG done August 09, 2002 which showed 3-1/2-4-1/2 Hz spike and slow wave complexes during photic stimulation. She drives without restrictions and is not interested in coming off medication, so repeat EEGs for the purpose of tapering medication have not been performed.   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 She will finish school in December, and is undecided if she will apply to an occupational therapy program or look for employment in recreational therapy.   Jennifer Galloway says that she has been doing well since her last visit. She has had no seizures or no syncopal episodes related to POTS. She strictly adheres to the prescribed program of medication, exercise, thigh high compression stockings, and fluid intake, and says that she  feels quite well. Jennifer Galloway has no other health concerns today other than previously mentioned.   Review of Systems: Please see the HPI for neurologic and other pertinent review of systems. Otherwise, the following systems are noncontributory including constitutional, eyes, ears, nose and throat, cardiovascular, respiratory, gastrointestinal, genitourinary, musculoskeletal, skin, endocrine, hematologic/lymph, allergic/immunologic and psychiatric.   Past Medical History:  Diagnosis Date  . Orthostatic hypotension dysautonomic syndrome (HCC)   . Photosensitive reflex epilepsy (HCC)   . POTS (postural orthostatic tachycardia syndrome)   . Seizures (HCC)    Hospitalizations: No., Head Injury: No., Nervous System Infections: No., Immunizations up to date: Yes.   Past Medical History Comments: See history  Surgical History Past Surgical History:  Procedure Laterality Date  . NO PAST SURGERIES      Family History family history includes Cancer in her paternal grandfather; Seizures in her mother; Thyroid disease in her mother. Family History is otherwise negative for migraines, seizures, cognitive impairment, blindness, deafness, birth defects, chromosomal disorder, autism.  Social History Social History   Social History  . Marital status: Single    Spouse name: N/A  . Number of children: N/A  . Years of education: N/A   Social History Main Topics  . Smoking status: Never Smoker  . Smokeless tobacco: Never Used  . Alcohol use No  . Drug use: No  . Sexual activity: No   Other Topics Concern  . Not on file   Social History Narrative   [name] is attending her final year of college at [employer]. She is majoring in Engineer, water.    Lives with her parents. She has adult siblings that do not live in the home.  Allergies Allergies  Allergen Reactions  . Ciprocinonide [Fluocinolone] Other (See Comments)    Pass Out  . Dye Fdc Red [Red Dye] Other (See Comments) and  Nausea And Vomiting    Allergy to Lamictal dye Allergy to Lamictal dye  . Other Other (See Comments) and Nausea And Vomiting    Allergy to artificial sweetners Allergy to artificial sweetners  . Aspartame Nausea And Vomiting    Physical Exam BP 110/70   Pulse 74   Ht 5' 10.28" (1.785 m)   Wt 153 lb 9.6 oz (69.7 kg)   BMI 21.87 kg/m  General: well developed, well nourished young woman, seated in exam room, in no evident distress Head: head normocephalic and atraumatic. Oropharynx benign. Neck: supple with no carotid or supraclavicular bruits Cardiovascular: regular rate and rhythm, no murmurs Skin: No rashes or lesions  Neurologic Exam Mental Status: Awake and fully alert. Oriented to place and time. Recent and remote memory intact. Attention span, concentration, and fund of knowledge appropriate. Mood and affect appropriate. Cranial Nerves: Fundoscopic exam revels sharp disc margins. Pupils equal, briskly reactive to light. Extraocular movements full without nystagmus. Visual fields full to confrontation. Hearing intact and symmetric to finger rub. Facial sensation intact. Face tongue, palate move normally and symmetrically. Neck flexion and extension normal. Motor: Normal bulk and tone. Normal strength in all tested extremity muscles. Sensory: Intact to touch and temperature in all extremities.  Coordination: Rapid alternating movements normal in all extremities. Finger-to-nose and heel-to shin performed accurately bilaterally. Romberg negative. Gait and Station: Arises from chair without difficulty. Stance is normal. Gait demonstrates normal stride length and balance. Able to heel, toe and tandem walk without difficulty. Reflexes: diminished and symmetric. Toes downgoing.  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 Postural orthostatic tachycardia syndrome   Recommendations for plan of care The [facility]  were [facility]. Jennifer Galloway has neither had nor required imaging or lab studies since the last visit. She is a 26 year old young woman with history of a seizure disorder and Postural Orthostatic Tachycardia Syndrome (POTS). She is followed by cardiology for POTS. She has been seizure free on Lamotrigine since December, 2010. Jennifer Galloway drives without restrictions and is not interested in tapering off medication. She will continue on her medication without change for now. 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 I talked with her about transferring care to an adult neurology provider after she graduates. I also told her that I will be happy to continue to see her if she decides to pursue graduate studies, and then help her transition to an adult neurology provider. Jennifer Galloway will let know what she decides to do. I will see Jennifer Galloway in 1 year or sooner if needed.   The medication list was reviewed and reconciled.  No changes were made in the prescribed medications today.  A complete medication list was provided to the patient.   Allergies as of 04/05/2017      Reactions   Ciprocinonide [fluocinolone] Other (See Comments)   Pass Out   Dye Fdc Red [red Dye] Other (See Comments), Nausea And Vomiting   Allergy to Lamictal dye Allergy to Lamictal dye   Other Other (See Comments), Nausea And Vomiting   Allergy to artificial sweetners Allergy to artificial sweetners   Aspartame Nausea And Vomiting      Medication List       Accurate  as of 04/05/17  4:16 PM. Always use your most recent med list.          fludrocortisone 0.1 MG tablet Commonly known as:  FLORINEF Take 0.1 mg by mouth 2 (two) times daily. 4:30 and 10 PM   lamoTRIgine 100 MG tablet Commonly known as:  LAMICTAL Take 1 tablet (100 mg total) by mouth 3 (three) times daily.   metoprolol succinate 25 MG 24 hr tablet Commonly known  as:  TOPROL-XL Take 25 mg by mouth.   midodrine 5 MG tablet Commonly known as:  PROAMATINE Take 10 mg by mouth 3 (three) times daily with meals. 4:30, 11, 4   promethazine 12.5 MG tablet Commonly known as:  PHENERGAN Take 12.5 mg by mouth every 6 (six) hours as needed.       Total time spent with the patient was 25 minutes, of which 50% or more was spent in counseling and coordination of care.   Elveria Rising NP-C

## 2017-10-07 ENCOUNTER — Other Ambulatory Visit (INDEPENDENT_AMBULATORY_CARE_PROVIDER_SITE_OTHER): Payer: Self-pay | Admitting: Family

## 2017-10-07 DIAGNOSIS — G40802 Other epilepsy, not intractable, without status epilepticus: Secondary | ICD-10-CM

## 2017-10-07 DIAGNOSIS — G40309 Generalized idiopathic epilepsy and epileptic syndromes, not intractable, without status epilepticus: Secondary | ICD-10-CM

## 2017-11-24 ENCOUNTER — Other Ambulatory Visit (INDEPENDENT_AMBULATORY_CARE_PROVIDER_SITE_OTHER): Payer: Self-pay | Admitting: Family

## 2017-11-24 DIAGNOSIS — G40309 Generalized idiopathic epilepsy and epileptic syndromes, not intractable, without status epilepticus: Secondary | ICD-10-CM

## 2017-11-24 DIAGNOSIS — G40802 Other epilepsy, not intractable, without status epilepticus: Secondary | ICD-10-CM

## 2018-04-10 ENCOUNTER — Telehealth: Payer: Self-pay | Admitting: Family

## 2018-04-10 NOTE — Telephone Encounter (Signed)
Patient is aware that Jennifer Galloway is out of the office until tomorrow.

## 2018-04-10 NOTE — Telephone Encounter (Signed)
°  Who's calling (name and relationship to patient) : Landis GandyStringer, Jennifer Galloway (Self)  Best contact number: [phone] Judie Petit(M)  Provider they see: Inetta Fermoina  Reason for call: patient called stating she has testing scheduled in July and she would like provider to complete form stating special accomodation is needed. She is not sure if Inetta Fermoina would like her to fax or email forms

## 2018-04-11 NOTE — Telephone Encounter (Signed)
I completed the form. Please email back to ShepherdElizabeth. Thanks, Inetta Fermoina

## 2018-04-11 NOTE — Telephone Encounter (Signed)
Please let Jennifer Galloway know that I will complete forms but that she needs to come in for a visit as she was last seen in May 2018. Thanks [facility]

## 2018-04-11 NOTE — Telephone Encounter (Signed)
Spoke with patient about her forms and appointment. She states that she can not come in as of now due to being in [place]. She states she will give us a call back

## 2018-04-11 NOTE — Telephone Encounter (Signed)
Form has been scanned and emailed back to patient

## 2018-04-13 ENCOUNTER — Telehealth (INDEPENDENT_AMBULATORY_CARE_PROVIDER_SITE_OTHER): Payer: Self-pay | Admitting: Family

## 2018-04-13 DIAGNOSIS — R569 Unspecified convulsions: Secondary | ICD-10-CM

## 2018-04-13 DIAGNOSIS — G40802 Other epilepsy, not intractable, without status epilepticus: Secondary | ICD-10-CM

## 2018-04-13 DIAGNOSIS — G40309 Generalized idiopathic epilepsy and epileptic syndromes, not intractable, without status epilepticus: Secondary | ICD-10-CM

## 2018-04-13 MED ORDER — LAMOTRIGINE 100 MG PO TABS: 100.0000 mg | ORAL_TABLET | Freq: Three times a day (TID) | ORAL | 3 refills | Status: DC

## 2018-04-13 NOTE — Telephone Encounter (Signed)
This was in the  Clinic box.  In trying to route it, I inadvertently closed it.

## 2018-04-13 NOTE — Telephone Encounter (Signed)
Who's calling (name and relationship to patient) : Jennifer Galloway (patient)  Best contact number: [phone]  Provider they see: Goodpasture  Reason for call: Patient stated she lives in [place] and need a refill of medication.  She said she is in the process of finding a Neuro doctor there.  She is currently of out medication.  Please call         PRESCRIPTION REFILL ONLY  Name of prescription: Lamotrigine   [facility] [address] [place], [place] [phone]

## 2018-04-13 NOTE — Telephone Encounter (Signed)
I sent in the refill this afternoon. Jennifer Galloway is aware because I had to call her to verify the pharmacy. The information in the message below is incorrect. TG

## 2018-05-17 ENCOUNTER — Other Ambulatory Visit (INDEPENDENT_AMBULATORY_CARE_PROVIDER_SITE_OTHER): Payer: Self-pay | Admitting: Family

## 2018-05-17 DIAGNOSIS — G40309 Generalized idiopathic epilepsy and epileptic syndromes, not intractable, without status epilepticus: Secondary | ICD-10-CM

## 2018-05-17 DIAGNOSIS — G40802 Other epilepsy, not intractable, without status epilepticus: Secondary | ICD-10-CM

## 2018-05-17 MED ORDER — LAMOTRIGINE 100 MG PO TABS: 100.0000 mg | ORAL_TABLET | Freq: Three times a day (TID) | ORAL | 3 refills | Status: DC

## 2018-05-17 NOTE — Telephone Encounter (Signed)
I called Jennifer Galloway and told her that the Rx has been sent to the pharmacy. I also told her that I made a referral [place] [facility] [place] for [place]. The phone # is [phone] and fax # [phone]. I was told that office would call her but I also encouraged Jennifer Galloway to contact the office about an appointment. TG

## 2018-05-17 NOTE — Telephone Encounter (Signed)
Rx has been sent to the pharmacy electronically. ° °

## 2018-05-17 NOTE — Telephone Encounter (Signed)
°  Who's calling (name and relationship to patient) : Jennifer Galloway (Patient) Best contact number: [phone] Provider they see: Inetta Fermoina Reason for call: Refill on Lamotrigine.      PRESCRIPTION REFILL ONLY  Name of prescription: Lamotrigine [facility] [address] [facility]  [place], [place] [id number] [address]

## 2018-08-09 DIAGNOSIS — E0590 Thyrotoxicosis, unspecified without thyrotoxic crisis or storm: Secondary | ICD-10-CM | POA: Insufficient documentation

## 2018-08-09 DIAGNOSIS — E063 Autoimmune thyroiditis: Secondary | ICD-10-CM | POA: Insufficient documentation

## 2018-09-05 ENCOUNTER — Ambulatory Visit (INDEPENDENT_AMBULATORY_CARE_PROVIDER_SITE_OTHER): Payer: 59 | Admitting: Family

## 2018-09-07 ENCOUNTER — Ambulatory Visit (INDEPENDENT_AMBULATORY_CARE_PROVIDER_SITE_OTHER): Payer: BLUE CROSS/BLUE SHIELD | Admitting: Family

## 2018-09-07 ENCOUNTER — Encounter (INDEPENDENT_AMBULATORY_CARE_PROVIDER_SITE_OTHER): Payer: Self-pay | Admitting: Family

## 2018-09-07 VITALS — BP 112/78 | Ht 70.0 in | Wt 148.6 lb

## 2018-09-07 DIAGNOSIS — G40802 Other epilepsy, not intractable, without status epilepticus: Secondary | ICD-10-CM

## 2018-09-07 DIAGNOSIS — G40309 Generalized idiopathic epilepsy and epileptic syndromes, not intractable, without status epilepticus: Secondary | ICD-10-CM

## 2018-09-07 DIAGNOSIS — I951 Orthostatic hypotension: Secondary | ICD-10-CM

## 2018-09-07 DIAGNOSIS — R000 Tachycardia, unspecified: Secondary | ICD-10-CM | POA: Diagnosis not present

## 2018-09-07 DIAGNOSIS — G90A Postural orthostatic tachycardia syndrome (POTS): Secondary | ICD-10-CM

## 2018-09-07 NOTE — Progress Notes (Signed)
Patient: Jennifer Galloway MRN: [id number] Sex: female DOB: 1991-10-27  Provider: Elveria Rising, NP Location of Care: [facility] Child Neurology  Note type: Routine return visit  History of Present Illness: Referral Source: Dr. Cephus Slater History from: patient and [facility] chart Chief Complaint: seizures  Jennifer Galloway is a 27 y.o. woman with history of photosensitive seizure disorder and Postural Orthostatic Tachycardia Syndrome (POTS). She was last seen Apr 05, 2017. She is followed by cardiology for POTS and takes Florinef, Toprol-XL, and Midodrine for that. She is taking and tolerating Lamotrigine and has remained seizure free since November 02, 2009.   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 She has returned to live in [place] and is considering applying to Occupational Therapy school.   Jennifer Galloway says that she had pneumonia a few months ago while living in [place]. She has been otherwise generally healthy and has no other health concerns today other than previously mentioned.  Review of Systems: Please see the HPI for neurologic and other pertinent review of systems. Otherwise, all other systems were reviewed and were negative.    Past Medical History:  Diagnosis Date  . Orthostatic hypotension dysautonomic syndrome (HCC)   . Photosensitive reflex epilepsy (HCC)   . POTS (postural orthostatic tachycardia syndrome)   . Seizures (HCC)    Hospitalizations: No., Head Injury: No., Nervous System Infections: No., Immunizations up to date: Yes.   Past Medical History Comments:  EEG has shown evidence of photo myoclonic or photo convulsive responses are generalized spike and slow wave activity associated with photic stimulation, but she has also had clinical seizures. The last occurred on November 02, 2009. [name] has been seizure free on Lamotrigine since that time. EEG June 03, 2006 showed  polyspike and slow-wave discharges with photic stimulation. This was similar to EEG done August 09, 2002 which showed 3-1/2-4-1/2 Hz spike and slow wave complexes during photic stimulation. She drives without restrictions and is not interested in coming off medication, so repeat EEGs for the purpose of tapering medication have not been performed.   Surgical History Past Surgical History:  Procedure Laterality Date  . NO PAST SURGERIES      Family History family history includes Cancer in her paternal grandfather; Seizures in her mother; Thyroid disease in her mother. Family History is otherwise negative for migraines, seizures, cognitive impairment, blindness, deafness, birth defects, chromosomal disorder, autism.  Social History Social History   Socioeconomic History  . Marital status: Single    Spouse name: Not on file  . Number of children: Not on file  . Years of education: Not on file  . Highest education level: Not on file  Occupational History  . Not on file  Social Needs  . Financial resource strain: Not on file  . Food insecurity:    Worry: Not on file    Inability: Not on file  . Transportation needs:    Medical: Not on file    Non-medical: Not on file  Tobacco Use  . Smoking status: Never Smoker  . Smokeless tobacco: Never Used  Substance and Sexual Activity  . Alcohol use: No  . Drug use: No  . Sexual activity: Never    Birth control/protection: Injection, None  Lifestyle  . Physical activity:    Days per week: Not on file    Minutes per session: Not on file  . Stress: Not on file  Relationships  . Social connections:  Talks on phone: Not on file    Gets together: Not on file    Attends religious service: Not on file    Active member of club or organization: Not on file    Attends meetings of clubs or organizations: Not on file    Relationship status: Not on file  Other Topics Concern  . Not on file  Social History Narrative   Jennifer Galloway is  attending her final year of college at [employer] in Dec. . She is majoring in Engineer, water.    Lives with her parents. She has adult siblings that do not live in the home.        Allergies Allergies  Allergen Reactions  . Ciprocinonide [Fluocinolone] Other (See Comments)    Pass Out  . Dye Fdc Red [Red Dye] Other (See Comments) and Nausea And Vomiting    Allergy to Lamictal dye Allergy to Lamictal dye  . Other Other (See Comments) and Nausea And Vomiting    Allergy to artificial sweetners Allergy to artificial sweetners  . Aspartame Nausea And Vomiting    Physical Exam BP 112/78   Ht 5\' 10"  (1.778 m)   Wt 148 lb 9.6 oz (67.4 kg)   BMI 21.32 kg/m  General: Well developed, well nourished, seated, in no evident distress, brown hair, blue eyes, right handed Head: Head normocephalic and atraumatic.  Oropharynx benign. Neck: Supple with no carotid bruits Cardiovascular: Regular rate and rhythm, no murmurs Respiratory: Breath sounds clear to auscultation Musculoskeletal: No obvious deformities or scoliosis Skin: No rashes or neurocutaneous lesions  Neurologic Exam Mental Status: Awake and fully alert.  Oriented to place and time.  Recent and remote memory intact.  Attention span, concentration, and fund of knowledge appropriate.  Mood and affect appropriate. Cranial Nerves: Fundoscopic exam reveals sharp disc margins.  Pupils equal, briskly reactive to light.  Extraocular movements full without nystagmus.  Visual fields full to confrontation.  Hearing intact and symmetric to finger rub.  Facial sensation intact.  Face tongue, palate move normally and symmetrically.  Neck flexion and extension normal. Motor: Normal bulk and tone. Normal strength in all tested extremity muscles. Sensory: Intact to touch and temperature in all extremities.  Coordination: Rapid alternating movements normal in all extremities.  Finger-to-nose and heel-to shin performed accurately bilaterally.  Romberg  negative. Gait and Station: Arises from chair without difficulty.  Stance is normal. Gait demonstrates normal stride length and balance.   Able to heel, toe and tandem walk without difficulty. Reflexes: 1+ and symmetric. Toes downgoing.  Impression 1.  Generalized convulsive epilepsy 2.  Photosensitive epilepsy 3.  Postural orthostatic tachycardia syndrome (POTS)  Recommendations for plan of care The patient's previous [facility] records were reviewed. Jennifer Galloway has neither had nor required imaging or lab studies since the last visit. She is a   year old woman with history of photosensitive epilepsy and POTS. She has remained seizure free on Lamotrigine since November 02, 2009. She will continue on her medication without change and will return for follow up in 1 year or sooner if needed.   The medication list was reviewed and reconciled.  No changes were made in the prescribed medications today.  A complete medication list was provided to the patient.  Allergies as of 09/07/2018      Reactions   Ciprocinonide [fluocinolone] Other (See Comments)   Pass Out   Dye Fdc Red [red Dye] Other (See Comments), Nausea And Vomiting   Allergy to Lamictal dye Allergy to Lamictal dye  Other Other (See Comments), Nausea And Vomiting   Allergy to artificial sweetners Allergy to artificial sweetners   Aspartame Nausea And Vomiting      Medication List        Accurate as of 09/07/18 11:20 AM. Always use your most recent med list.          fludrocortisone 0.1 MG tablet Commonly known as:  FLORINEF Take 0.1 mg by mouth 2 (two) times daily. 4:30 and 10 PM   lamoTRIgine 100 MG tablet Commonly known as:  LAMICTAL Take 1 tablet (100 mg total) by mouth 3 (three) times daily.   metoprolol succinate 25 MG 24 hr tablet Commonly known as:  TOPROL-XL Take 25 mg by mouth.   midodrine 5 MG tablet Commonly known as:  PROAMATINE Take 10 mg by mouth 3 (three) times daily with meals. 4:30, 11, 4     promethazine 12.5 MG tablet Commonly known as:  PHENERGAN Take 12.5 mg by mouth every 6 (six) hours as needed.       Total time spent with the patient was 20 minutes, of which 50% or more was spent in counseling and coordination of care.   Jennifer Rising NP-C

## 2018-09-10 ENCOUNTER — Encounter (INDEPENDENT_AMBULATORY_CARE_PROVIDER_SITE_OTHER): Payer: Self-pay | Admitting: Family

## 2018-09-10 MED ORDER — LAMOTRIGINE 100 MG PO TABS: 100.0000 mg | ORAL_TABLET | Freq: Three times a day (TID) | ORAL | 5 refills | Status: DC

## 2018-09-10 NOTE — Patient Instructions (Signed)
Thank you for coming in today.   Instructions for you until your next appointment are as follows: 1. Continue your Lamotrigine without change.  2. Let me know if you have any seizures 3. Continue to follow up with your cardiologist for POTS 4. Please sign up for MyChart if you have not done so 5. Please plan to return for follow up in one year or sooner if needed.

## 2018-09-19 ENCOUNTER — Telehealth (INDEPENDENT_AMBULATORY_CARE_PROVIDER_SITE_OTHER): Payer: Self-pay | Admitting: Family

## 2018-09-19 DIAGNOSIS — G40802 Other epilepsy, not intractable, without status epilepticus: Secondary | ICD-10-CM

## 2018-09-19 DIAGNOSIS — G40309 Generalized idiopathic epilepsy and epileptic syndromes, not intractable, without status epilepticus: Secondary | ICD-10-CM

## 2018-09-19 NOTE — Telephone Encounter (Signed)
°  Who's calling (name and relationship to patient) : Lanora Manislizabeth (Patient)  Best contact number: [phone] Provider they see: Inetta Fermoina  Reason for call: Pt would like for Inetta Fermoina to give her a return call at her convenience. Pt is needing a letter documenting patient's need for Lamictal. The letter is for the [facility].

## 2018-09-20 MED ORDER — LAMOTRIGINE 100 MG PO TABS: 100.0000 mg | ORAL_TABLET | Freq: Three times a day (TID) | ORAL | 5 refills | Status: DC

## 2018-09-21 ENCOUNTER — Telehealth (INDEPENDENT_AMBULATORY_CARE_PROVIDER_SITE_OTHER): Payer: Self-pay | Admitting: Family

## 2018-09-21 NOTE — Telephone Encounter (Signed)
See previous phone note. TG 

## 2018-09-21 NOTE — Telephone Encounter (Signed)
°  Who's calling (name and relationship to patient) : Lanora Manislizabeth (mom) Best contact number: [phone] Provider they see: Blane OharaGoodpasture  Reason for call: Need [facility] for [facility] concerning her medication, please call for clarity.    PRESCRIPTION REFILL ONLY  Name of prescription:  Pharmacy:

## 2018-09-21 NOTE — Telephone Encounter (Signed)
I called and talked to [facility]. She said that she received a letter from the [facility] requesting a letter to explain why she takes Lamictal. She asked for the letter to be emailed to her at [email or web address], which I will do. TG

## 2018-10-25 DIAGNOSIS — Z01419 Encounter for gynecological examination (general) (routine) without abnormal findings: Secondary | ICD-10-CM | POA: Insufficient documentation

## 2018-11-03 ENCOUNTER — Telehealth (INDEPENDENT_AMBULATORY_CARE_PROVIDER_SITE_OTHER): Payer: Self-pay | Admitting: Family

## 2018-11-03 NOTE — Telephone Encounter (Signed)
°  Who's calling (name and relationship to patient) : Wilford CornerElizabeth Galloway   Best contact number: [phone]   Provider they see: Elveria Risingina Goodpasture   Reason for call:  [name] called in with a few questions about her DMV Restrictions on her license.She would like to speak with Inetta Fermoina when she gets a moment when she is back in the office. Please advise.

## 2018-11-03 NOTE — Telephone Encounter (Signed)
Spoke with patient to make sure she was aware that Inetta Fermoina is out of the office until Monday. Invited her to inform me of the questions she has but she refused. She only wants to speak with Inetta Fermoina

## 2018-11-06 NOTE — Telephone Encounter (Signed)
I called and spoke with Jennifer Galloway. She said that she was appealing to the [facility] to remove a restriction on her driver's license for "no driving on interstate highways". She asked for updated letter from me to add to her appeal. I sent her a letter as requested. TG

## 2018-12-12 ENCOUNTER — Telehealth (INDEPENDENT_AMBULATORY_CARE_PROVIDER_SITE_OTHER): Payer: Self-pay | Admitting: Family

## 2018-12-12 NOTE — Telephone Encounter (Signed)
Spoke with the patient about her phone message. She states that she has been taking the Lamictal for so long and she has been struggling with acne. She states that majority of the anticonvulsant medications can cause acne. She wants to see if there are medications she can take to get rid of this acne. Please advise. She is aware that Inetta Fermo is out of the office at this time

## 2018-12-12 NOTE — Telephone Encounter (Signed)
°  Who's calling (name and relationship to patient) : Natassha Coker contact number: [phone] Provider they see: Blane Ohara Reason for call: Rosealee wanted to know if she could have an updated EEG done. Please call.    PRESCRIPTION REFILL ONLY  Name of prescription:  Pharmacy:

## 2018-12-13 NOTE — Telephone Encounter (Signed)
I called and talked with Jennifer Galloway and Jennifer Galloway. She said that she has been having problems with acne and that she has been told that it is related to Lamictal. She wanted to have an EEG and switch to a different medication.  Jennifer Galloway is also concerned about Jennifer taking seizure medication now that she is an adult and may want to have children. I told Jennifer Galloway and Jennifer Galloway that I would schedule an EEG but that if we switched medication that she would not be allowed to drive for at least 90 days. After discussion, she decided to wait until closer to May or June as she will not need to drive during the summer. Jennifer Galloway will call me back when she is ready to schedule. TG

## 2019-05-09 HISTORY — PX: PACEMAKER REMOVAL: SHX5066

## 2019-05-29 ENCOUNTER — Other Ambulatory Visit (INDEPENDENT_AMBULATORY_CARE_PROVIDER_SITE_OTHER): Payer: Self-pay | Admitting: Family

## 2019-05-29 DIAGNOSIS — G40309 Generalized idiopathic epilepsy and epileptic syndromes, not intractable, without status epilepticus: Secondary | ICD-10-CM

## 2019-05-29 DIAGNOSIS — G40802 Other epilepsy, not intractable, without status epilepticus: Secondary | ICD-10-CM

## 2019-05-29 MED ORDER — LAMOTRIGINE 100 MG PO TABS: 100.0000 mg | ORAL_TABLET | Freq: Three times a day (TID) | ORAL | 2 refills | Status: DC

## 2019-05-29 NOTE — Telephone Encounter (Signed)
Who's calling (name and relationship to patient) : Tonilynn Bieker (self)  Best contact number: [phone]  Provider they see: Rockwell Germany  Reason for call:  Lamictal 100mg  new Rx needs to be sent over for refill  Call ID:      Beulah  Name of prescription: Lamictal 100mg    Pharmacy: [facility] [address] [place]

## 2019-05-29 NOTE — Telephone Encounter (Signed)
Rx has been sent to the pharmacy requested 

## 2019-08-14 ENCOUNTER — Other Ambulatory Visit: Payer: Self-pay

## 2019-08-14 ENCOUNTER — Ambulatory Visit (INDEPENDENT_AMBULATORY_CARE_PROVIDER_SITE_OTHER): Payer: BLUE CROSS/BLUE SHIELD | Admitting: Family

## 2019-08-14 ENCOUNTER — Encounter (INDEPENDENT_AMBULATORY_CARE_PROVIDER_SITE_OTHER): Payer: Self-pay | Admitting: Family

## 2019-08-14 VITALS — BP 130/108 | HR 72 | Ht 70.5 in | Wt 160.8 lb

## 2019-08-14 DIAGNOSIS — G40309 Generalized idiopathic epilepsy and epileptic syndromes, not intractable, without status epilepticus: Secondary | ICD-10-CM

## 2019-08-14 DIAGNOSIS — I498 Other specified cardiac arrhythmias: Secondary | ICD-10-CM | POA: Diagnosis not present

## 2019-08-14 DIAGNOSIS — G40802 Other epilepsy, not intractable, without status epilepticus: Secondary | ICD-10-CM | POA: Diagnosis not present

## 2019-08-14 DIAGNOSIS — G90A Postural orthostatic tachycardia syndrome (POTS): Secondary | ICD-10-CM

## 2019-08-14 MED ORDER — LAMOTRIGINE 100 MG PO TABS: 100.0000 mg | ORAL_TABLET | Freq: Three times a day (TID) | ORAL | 5 refills | Status: DC

## 2019-08-14 NOTE — Patient Instructions (Signed)
Thank you for coming in today.   Instructions for you until your next appointment are as follows: 1. Continue your medication as you have been taking it 2. Let me know if you have any seizures 3. I will refer you to Dr Ellouise Newer with [facility]. This will transfer your care to an adult neurology practice. I will take care of refills and any problems you may have until you are established with Dr Delice Lesch.  4. Please sign up for MyChart if you have not done so

## 2019-08-14 NOTE — Progress Notes (Signed)
Jennifer Galloway   MRN:  [id number]  Jul 09, 1991   Provider: Rockwell Germany NP-C Location of Care: [facility]  Visit type: Routine visit  Last visit: 09/07/2018  Referral source: Dr. Pricilla Holm History from: patient and chcn chart  Brief history:  History of photosensitive seizure disorder and postural orthostatic tachycardia syndrome (POTS), which is managed by cardiology. She is taking and tolerating Lamotrigine for seizures and has remained seizure free since November 02, 2009. She takes Florinef, Toprol XL and Midodrine for POTS.    Today's concerns:  Jennifer Galloway tells me today that she has remained seizure free since her last visit. 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 Her family has a home in [place] as well as [place] and she has just returned from staying in [place] for awhile. 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 Jennifer Galloway has been otherwise generally healthy and has no other health concerns today other than previously mentioned .  Review of systems: Please see HPI for neurologic and other pertinent review of systems. Otherwise all other systems were reviewed and were negative.  Problem List: Patient Active Problem List   Diagnosis Date Noted   Seizure ([place]) 12/11/2014   Syncope 12/11/2014   POTS (postural orthostatic tachycardia syndrome) 12/11/2012   Photosensitive reflex epilepsy ([place]) 12/11/2012   Generalized convulsive epilepsy ([place]) 10/21/2012   Syncope and collapse 10/21/2012     Past Medical History:  Diagnosis Date   Orthostatic hypotension dysautonomic syndrome (HCC)    Photosensitive reflex epilepsy (HCC)    POTS (postural orthostatic tachycardia syndrome)    Seizures ([place])     Past medical history comments: See  HPI Copied from previous record: EEG has shown evidence of photo myoclonic or photo convulsive responses are generalized spike and slow wave activity associated with photic stimulation, but she has also had clinical seizures. The last occurred on November 02, 2009. Jennifer Galloway has been seizure free on Lamotrigine since that time. EEG June 03, 2006 showed polyspike and slow-wave discharges with photic stimulation. This was similar to EEG done August 09, 2002 which showed 3-1/2-4-1/2 Hz spike and slow wave complexes during photic stimulation. She drives without restrictions and is not interested in coming off medication, so repeat EEGs for the purpose of tapering medication have not been performed.  Surgical history: Past Surgical History:  Procedure Laterality Date   NO PAST SURGERIES       Family history: family history includes Cancer in her paternal grandfather; Seizures in her mother; Thyroid disease in her mother.   Social history: Social History   Socioeconomic History   Marital status: Single    Spouse name: Not on file   Number of children: Not on file   Years of education: Not on file   Highest education level: Not on file  Occupational History   Not on file  Social Needs   Financial resource strain: Not on file   Food insecurity    Worry: Not on file    Inability: Not on file   Transportation needs    Medical: Not on file    Non-medical: Not on file  Tobacco Use   Smoking status: Never Smoker   Smokeless tobacco: Never Used  Substance and Sexual Activity   Alcohol use: No   Drug  use: No   Sexual activity: Never    Birth control/protection: Injection, None  Lifestyle   Physical activity    Days per week: Not on file    Minutes per session: Not on file   Stress: Not on file  Relationships   Social connections    Talks on phone: Not on file    Gets together: Not on file    Attends religious service: Not on file    Active member of club or  organization: Not on file    Attends meetings of clubs or organizations: Not on file    Relationship status: Not on file   Intimate partner violence    Fear of current or ex partner: Not on file    Emotionally abused: Not on file    Physically abused: Not on file    Forced sexual activity: Not on file  Other Topics Concern   Not on file  Social History Narrative   Chonita graduated last year in December from [employer]. 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    Lives with her parents. She has adult siblings that do not live in the home.         Past/failed meds:   Allergies: Allergies  Allergen Reactions   Ciprocinonide [Fluocinolone] Other (See Comments)    Pass Out   Dye Fdc Red [Red Dye] Other (See Comments) and Nausea And Vomiting    Allergy to Lamictal dye Allergy to Lamictal dye   Other Other (See Comments) and Nausea And Vomiting    Allergy to artificial sweetners Allergy to artificial sweetners   Aspartame Nausea And Vomiting     Immunizations:  There is no immunization history on file for this patient.    Diagnostics/Screenings: 12/12/14 - rEEG - This is an abnormal electroencephalogram secondary to bursts of generalized and right hemispheric polyspike, spike and slow wave activity.  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  Jennifer Farr, MD   Physical Exam: BP (!) 130/108    Pulse 72    Ht 5' 10.5" (1.791 m)    Wt 160 lb 12.8 oz (72.9 kg)    BMI 22.75 kg/m   General: Well developed, well nourished young woman, seated on exam table, in no evident distress, brown hair, blue eyes, right handed Head: Head normocephalic and atraumatic.  Oropharynx benign. Neck: Supple with no carotid bruits Cardiovascular: Regular rate and rhythm, no murmurs Respiratory: Breath sounds clear to auscultation Musculoskeletal: No obvious deformities or scoliosis Skin: No  rashes or neurocutaneous lesions  Neurologic Exam Mental Status: Awake and fully alert.  Oriented to place and time.  Recent and remote memory intact.  Attention span, concentration, and fund of knowledge appropriate.  Mood and affect appropriate. Cranial Nerves: Fundoscopic exam reveals sharp disc margins.  Pupils equal, briskly reactive to light.  Extraocular movements full without nystagmus.  Visual fields full to confrontation.  Hearing intact and symmetric to finger rub.  Facial sensation intact.  Face tongue, palate move normally and symmetrically.  Neck flexion and extension normal. Motor: Normal bulk and tone. Normal strength in all tested extremity muscles. Sensory: Intact to touch and temperature in all extremities.  Coordination: Rapid alternating movements normal in all extremities.  Finger-to-nose and heel-to shin performed accurately bilaterally.  Romberg negative. Gait and Station: Arises from chair without difficulty.  Stance is normal. Gait demonstrates normal stride length and balance.   Able to heel,  toe and tandem walk without difficulty. Reflexes: 1+ and symmetric. Toes downgoing.  Impression: 1. Generalized convulsive epilepsy 2. Photosensitive epilepsy 3. Postural orthostatic tachycardia syndrome (POTS)   Recommendations for plan of care: [facility]. Jennifer Galloway has neither had nor required imaging or lab studies since the last visit. She is a 28 year old young woman with photosensitive epilepsy and postural orthostatic tachycardia syndrome (POTS) managed by cardiology. She is taking and tolerating Lamotrigine for her seizure disorder and has remained seizure free since November 02, 2009. She is not interested in considering tapering off medication because she wants to continue to possess a driver's license. I talked with Jennifer Galloway about transferring her care to an adult neurology provider and she agreed with that plan. I will refer her to  Dr Patrcia DollyKaren Aquino for transfer of care but will manage refills and any problems until she is established with Dr Karel JarvisAquino. I asked Jennifer Galloway to let me know if she has any seizures or any concerns and encouraged her to continue to follow up with cardiology for POTS.  She agreed with the plans made today.   The medication list was reviewed and reconciled. No changes were made in the prescribed medications today. A complete medication list was provided to the patient.  Allergies as of 08/14/2019      Reactions   Ciprocinonide [fluocinolone] Other (See Comments)   Pass Out   Dye Fdc Red [red Dye] Other (See Comments), Nausea And Vomiting   Allergy to Lamictal dye Allergy to Lamictal dye   Other Other (See Comments), Nausea And Vomiting   Allergy to artificial sweetners Allergy to artificial sweetners   Aspartame Nausea And Vomiting      Medication List       Accurate as of August 14, 2019 11:23 AM. If you have any questions, ask your nurse or doctor.        fludrocortisone 0.1 MG tablet Commonly known as: FLORINEF Take 0.1 mg by mouth 2 (two) times daily. 4:30 and 10 PM   lamoTRIgine 100 MG tablet Commonly known as: LAMICTAL Take 1 tablet (100 mg total) by mouth 3 (three) times daily.   metoprolol succinate 25 MG 24 hr tablet Commonly known as: TOPROL-XL Take 25 mg by mouth.   midodrine 5 MG tablet Commonly known as: PROAMATINE Take 10 mg by mouth 3 (three) times daily with meals. 4:30, 11, 4   promethazine 12.5 MG tablet Commonly known as: PHENERGAN Take 12.5 mg by mouth every 6 (six) hours as needed.       Total time spent with the patient was 20 minutes, of which 50% or more was spent in counseling and coordination of care.  Elveria Risingina Uniqua Kihn NP-C [facility] Ph. [phone] Fax [phone]

## 2019-10-18 ENCOUNTER — Encounter: Payer: Self-pay | Admitting: Neurology

## 2019-10-18 ENCOUNTER — Telehealth (INDEPENDENT_AMBULATORY_CARE_PROVIDER_SITE_OTHER): Payer: BLUE CROSS/BLUE SHIELD | Admitting: Neurology

## 2019-10-18 ENCOUNTER — Other Ambulatory Visit: Payer: Self-pay

## 2019-10-18 VITALS — Ht 70.0 in | Wt 157.0 lb

## 2019-10-18 DIAGNOSIS — I498 Other specified cardiac arrhythmias: Secondary | ICD-10-CM | POA: Diagnosis not present

## 2019-10-18 DIAGNOSIS — G40802 Other epilepsy, not intractable, without status epilepticus: Secondary | ICD-10-CM

## 2019-10-18 DIAGNOSIS — G90A Postural orthostatic tachycardia syndrome (POTS): Secondary | ICD-10-CM

## 2019-10-18 DIAGNOSIS — G40309 Generalized idiopathic epilepsy and epileptic syndromes, not intractable, without status epilepticus: Secondary | ICD-10-CM | POA: Diagnosis not present

## 2019-10-18 MED ORDER — LAMOTRIGINE 100 MG PO TABS: ORAL_TABLET | ORAL | 3 refills | Status: DC
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
## 2020-02-14 ENCOUNTER — Other Ambulatory Visit: Payer: Self-pay

## 2020-02-14 ENCOUNTER — Ambulatory Visit (INDEPENDENT_AMBULATORY_CARE_PROVIDER_SITE_OTHER): Payer: BLUE CROSS/BLUE SHIELD | Admitting: Neurology

## 2020-02-14 DIAGNOSIS — G40802 Other epilepsy, not intractable, without status epilepticus: Secondary | ICD-10-CM

## 2020-02-14 MED ORDER — LAMOTRIGINE 100 MG PO TABS: ORAL_TABLET | ORAL | 3 refills | Status: DC
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
## 2020-02-14 NOTE — Patient Instructions (Signed)
Great seeing you! Continue Lamotrigine 100mg : take 1 and 1/2 tablets twice a day. Follow-up in 6 months, call for any changes  Seizure Precautions: 1. If medication has been prescribed for you to prevent seizures, take it exactly as directed.  Do not stop taking the medicine without talking to your doctor first, even if you have not had a seizure in a long time.   2. Avoid activities in which a seizure would cause danger to yourself or to others.  Don't operate dangerous machinery, swim alone, or climb in high or dangerous places, such as on ladders, roofs, or girders.  Do not drive unless your doctor says you may.  3. If you have any warning that you may have a seizure, lay down in a safe place where you can't hurt yourself.    4.  No driving for 6 months from last seizure, as per [facility].   Please refer to the following link on the Epilepsy Foundation of America's website for more information: http://www.epilepsyfoundation.org/answerplace/Social/driving/drivingu.cfm   5.  Maintain good sleep hygiene. Avoid alcohol.  6.  Notify your neurology if you are planning pregnancy or if you become pregnant.  7.  Contact your doctor if you have any problems that may be related to the medicine you are taking.  8.  Call 911 and bring the patient back to the ED if:        A.  The seizure lasts longer than 5 minutes.       B.  The patient doesn't awaken shortly after the seizure  C.  The patient has new problems such as difficulty seeing, speaking or moving  D.  The patient was injured during the seizure  E.  The patient has a temperature over 102 F (39C)  F.  The patient vomited and now is having trouble breathing

## 2020-02-25 ENCOUNTER — Ambulatory Visit: Payer: BLUE CROSS/BLUE SHIELD | Admitting: Neurology

## 2020-05-27 ENCOUNTER — Other Ambulatory Visit: Payer: Self-pay

## 2020-05-27 ENCOUNTER — Telehealth: Payer: Self-pay | Admitting: Neurology

## 2020-05-27 DIAGNOSIS — G40309 Generalized idiopathic epilepsy and epileptic syndromes, not intractable, without status epilepticus: Secondary | ICD-10-CM

## 2020-05-27 DIAGNOSIS — G90A Postural orthostatic tachycardia syndrome (POTS): Secondary | ICD-10-CM

## 2020-05-27 DIAGNOSIS — Z79899 Other long term (current) drug therapy: Secondary | ICD-10-CM

## 2020-05-27 NOTE — Telephone Encounter (Signed)
We did want to do a 1-hour EEG on prior visit, pls order 1-hour EEG. Ok to have bloodwork done closest to 6pm as possible, maybe last appointment at lab today. Thanks

## 2020-05-27 NOTE — Telephone Encounter (Signed)
Pt called no answer voice mail left for pt to call back 

## 2020-05-27 NOTE — Telephone Encounter (Signed)
Spoke to pt EEG order placed in Epic

## 2020-05-27 NOTE — Telephone Encounter (Signed)
Pls see if there are any triggers to these, do these feel like her episodes from the POTS, or do they feel similar to her seizures years ago? If no triggers, would check her Lamictal level, pls do first thing in the morning before she takes her morning dose. If passing out, should not be driving. Thanks

## 2020-05-27 NOTE — Telephone Encounter (Signed)
Pt called back episode 3 weeks ago not sure of the trigger, also this morning pt is unsure of trigger, she stated that she woke up at 6 am took her medication went back to bed, parents told her she walked down the steps face was purple, she was not aware of anything, she is now back to normal, she stated that it could be from POTS but not sure, pt asking about having an EEG? Also with pt taken her medication at 6 am asking would it be ok for to get her Lamictal level done this afternoon? Next appointment is 09/10/20

## 2020-05-27 NOTE — Telephone Encounter (Signed)
Patient called in wanting to get Dr. Karel Jarvis a message. She stated "she went down" this morning and said Dr. Karel Jarvis will know what she is talking about. This also happened 3 weeks ago as well. Would like some advice.

## 2020-05-28 ENCOUNTER — Telehealth: Payer: Self-pay | Admitting: Neurology

## 2020-05-28 NOTE — Telephone Encounter (Signed)
Pt called back she had her cardiology appointment yesterday they stated that is it coming from her POTS with increase stress and covid, he ordered new compression stockings told her how much water to drink a day, told to walk everyday and pt her on exercises to do. They also ordered CBC she is having all her lab work done today at her PCP they will fax [place] the results

## 2020-05-28 NOTE — Telephone Encounter (Signed)
Noted, thanks!

## 2020-05-28 NOTE — Telephone Encounter (Signed)
Patient called in and left a message returning Heather's call 

## 2020-06-11 ENCOUNTER — Telehealth: Payer: Self-pay | Admitting: Neurology

## 2020-06-11 NOTE — Telephone Encounter (Signed)
Pls let her know I reviewed labs from [facility]. Lamictal level is within range. How is she feeling? Thanks

## 2020-06-11 NOTE — Telephone Encounter (Signed)
Patient called to ask if Dr Karel Jarvis has received her lab results yet. Please call.

## 2020-06-11 NOTE — Telephone Encounter (Signed)
I don't see any labs here, pls check where it was done and ask her to have them fax to [place]?

## 2020-06-12 NOTE — Telephone Encounter (Signed)
Pt called no answer LVM to call back

## 2020-06-12 NOTE — Telephone Encounter (Signed)
Spoke with pt informed that Lamictal level was in range. Asked her she was doing?, stated she felt great , cardio told her to take her meds at 7 am not 6 not to interrupt her sleep been doing this for 2 weeks almost no issues with POTS

## 2020-09-08 ENCOUNTER — Telehealth: Payer: Self-pay | Admitting: Neurology

## 2020-09-08 DIAGNOSIS — G40309 Generalized idiopathic epilepsy and epileptic syndromes, not intractable, without status epilepticus: Secondary | ICD-10-CM

## 2020-09-08 DIAGNOSIS — G40802 Other epilepsy, not intractable, without status epilepticus: Secondary | ICD-10-CM

## 2020-09-08 NOTE — Addendum Note (Signed)
Addended by: Kandice Robinsons T on: 09/08/2020 02:32 PM   Modules accepted: Orders

## 2020-09-08 NOTE — Telephone Encounter (Signed)
Patient verified and referral sent.

## 2020-09-08 NOTE — Telephone Encounter (Signed)
Patient called in stating she needs to cancel her 09/10/20 appointment and would like a referral to a Neurologist that is in network with her insurance. The visits are too expensive for her. She thinks [facility] should be in network with her insurance.

## 2020-09-08 NOTE — Telephone Encounter (Signed)
That is okay, pls confirm with her if [facility] is in her insurance and send referral, thanks!

## 2020-09-10 ENCOUNTER — Ambulatory Visit: Payer: Self-pay | Admitting: Neurology

## 2021-02-08 ENCOUNTER — Other Ambulatory Visit: Payer: Self-pay

## 2021-02-08 ENCOUNTER — Encounter (HOSPITAL_BASED_OUTPATIENT_CLINIC_OR_DEPARTMENT_OTHER): Payer: Self-pay | Admitting: Emergency Medicine

## 2021-02-08 ENCOUNTER — Emergency Department (HOSPITAL_BASED_OUTPATIENT_CLINIC_OR_DEPARTMENT_OTHER)
Admission: EM | Admit: 2021-02-08 | Discharge: 2021-02-09 | Disposition: A | Discharge status: Home / Self Care | Payer: BLUE CROSS/BLUE SHIELD | Attending: Emergency Medicine | Admitting: Emergency Medicine

## 2021-02-08 DIAGNOSIS — W01198A Fall on same level from slipping, tripping and stumbling with subsequent striking against other object, initial encounter: Secondary | ICD-10-CM | POA: Diagnosis not present

## 2021-02-08 DIAGNOSIS — Y92000 Kitchen of unspecified non-institutional (private) residence as  the place of occurrence of the external cause: Secondary | ICD-10-CM | POA: Insufficient documentation

## 2021-02-08 DIAGNOSIS — S01411A Laceration without foreign body of right cheek and temporomandibular area, initial encounter: Secondary | ICD-10-CM | POA: Insufficient documentation

## 2021-02-08 DIAGNOSIS — S0083XA Contusion of other part of head, initial encounter: Secondary | ICD-10-CM

## 2021-02-08 DIAGNOSIS — Y9301 Activity, walking, marching and hiking: Secondary | ICD-10-CM | POA: Insufficient documentation

## 2021-02-08 DIAGNOSIS — S0181XA Laceration without foreign body of other part of head, initial encounter: Secondary | ICD-10-CM | POA: Insufficient documentation

## 2021-02-08 DIAGNOSIS — R569 Unspecified convulsions: Secondary | ICD-10-CM

## 2021-02-08 DIAGNOSIS — R55 Syncope and collapse: Secondary | ICD-10-CM | POA: Diagnosis not present

## 2021-02-08 DIAGNOSIS — S0993XA Unspecified injury of face, initial encounter: Secondary | ICD-10-CM | POA: Diagnosis present

## 2021-02-08 NOTE — ED Triage Notes (Signed)
Reports hx of POTS.  Passed out walking across the kitchen.  Hit the edge of the counter.  Laceration to chin, right side of the face, and busted her lip.

## 2021-02-08 NOTE — ED Notes (Signed)
Ambulated to restroom with nurse at side, min assistance required, gait very steady, no complaints voiced.

## 2021-02-09 ENCOUNTER — Emergency Department (HOSPITAL_BASED_OUTPATIENT_CLINIC_OR_DEPARTMENT_OTHER): Payer: BLUE CROSS/BLUE SHIELD

## 2021-02-09 DIAGNOSIS — S01411A Laceration without foreign body of right cheek and temporomandibular area, initial encounter: Secondary | ICD-10-CM | POA: Diagnosis not present

## 2021-02-09 MED ORDER — LORAZEPAM 2 MG/ML IJ SOLN: 2.0000 mg | Freq: Once | INTRAMUSCULAR | Status: AC

## 2021-02-09 MED ORDER — ONDANSETRON HCL 4 MG/2ML IJ SOLN
4.0000 mg | Freq: Once | INTRAMUSCULAR | Status: AC
  Administered 2021-02-09: 4 mg via INTRAVENOUS
  Filled 2021-02-09: qty 2

## 2021-02-09 MED ORDER — LIDOCAINE-EPINEPHRINE 2 %-1:100000 IJ SOLN
20.0000 mL | Freq: Once | INTRAMUSCULAR | Status: AC
  Administered 2021-02-09: 20 mL

## 2021-02-09 MED ORDER — SODIUM CHLORIDE 0.9 % IV BOLUS
1000.0000 mL | Freq: Once | INTRAVENOUS | Status: AC
  Administered 2021-02-09: 1000 mL via INTRAVENOUS

## 2021-02-09 MED ORDER — LIDOCAINE-EPINEPHRINE 2 %-1:100000 IJ SOLN: 20.0000 mL | Freq: Once | INTRAMUSCULAR | Status: DC

## 2021-02-09 MED ORDER — LORAZEPAM 2 MG/ML IJ SOLN
INTRAMUSCULAR | Status: AC
  Administered 2021-02-09: 2 mg
  Filled 2021-02-09: qty 2

## 2021-02-09 NOTE — ED Provider Notes (Addendum)
[facility] Provider Note: Lowella Dell, MD, FACEP  CSN: [id number] MRN: [id number] ARRIVAL: 02/08/21 at 2102 ROOM: MH03/MH03   CHIEF COMPLAINT  Syncope   HISTORY OF PRESENT ILLNESS  02/09/21 12:24 AM Jennifer Galloway is a 30 y.o. female with a history of POTS.  This is similar to syncopal episodes she has had in the past and the first she has had in about 6 months.  Her mother believes she has been overworking herself recently.  When she fell she struck her chin and right cheek against a counter.  She has a laceration to her chin and her right cheek as well as swelling to her right cheek.  She rates associated pain is a 4 out of 10, aching in nature, worse with palpation or movement.  She is feeling nauseated and Zofran was ordered by her nurse prior to my evaluation.   Past Medical History:  Diagnosis Date  . Attention deficit disorder 12/11/2012  . Orthostatic hypotension dysautonomic syndrome   . Photosensitive reflex epilepsy (HCC)   . POTS (postural orthostatic tachycardia syndrome)   . Seizures (HCC)     Past Surgical History:  Procedure Laterality Date  . PACEMAKER REMOVAL  05/2019    Family History  Problem Relation Age of Onset  . Thyroid disease Mother   . Seizures Mother   . Cancer Paternal Grandfather        Died at 29    Social History   Tobacco Use  . Smoking status: Never Smoker  . Smokeless tobacco: Never Used  Vaping Use  . Vaping Use: Never used  Substance Use Topics  . Alcohol use: No  . Drug use: No    Prior to Admission medications   Medication Sig Start Date End Date Taking? Authorizing Provider  ferrous sulfate 325 (65 FE) MG tablet Take 325 mg by mouth at bedtime.    [provider]  fludrocortisone (FLORINEF) 0.1 MG tablet Take 0.1 mg by mouth 2 (two) times daily. 4:30 and 10 PM    [provider]  fludrocortisone (FLORINEF) 0.1 MG tablet Fludrocortisone Acetate 0.1 MG Oral Tablet QTY: 0 tablet  Days: 0 Refills: 0  Written: 06/28/18 Patient Instructions: 06/28/18   [provider]  lamoTRIgine (LAMICTAL) 100 MG tablet Take 1.5 tablets twice a day 02/14/20   Van Clines, MD  metoprolol succinate (TOPROL-XL) 25 MG 24 hr tablet Take 25 mg by mouth. 06/03/15 02/14/20  [provider]  midodrine (PROAMATINE) 5 MG tablet Take 10 mg by mouth 3 (three) times daily with meals. 4:30, 11, 4    [provider]  promethazine (PHENERGAN) 12.5 MG tablet Take 12.5 mg by mouth every 6 (six) hours as needed.  10/12/13   [provider]    Allergies Ciprocinonide [fluocinolone], Dye fdc red [red dye], Other, Ciprofloxacin, and Aspartame   REVIEW OF SYSTEMS  Negative except as noted here or in the History of Present Illness.   PHYSICAL EXAMINATION  Initial Vital Signs Blood pressure (!) 166/110, pulse 87, temperature 98.3 F (36.8 C), temperature source Oral, resp. rate 16, height 5\' 10"  (1.778 m), weight 70.3 kg, last menstrual period 01/03/2021, SpO2 100 %.  Examination General: Well-developed, well-nourished female in no acute distress; appearance consistent with age of record HENT: normocephalic; atraumatic; laceration, swelling and tenderness of right cheek with laceration to underside of chin:    Eyes: pupils equal, round and reactive to light; extraocular muscles intact Neck: supple; nontender Heart: regular rate  and rhythm Lungs: clear to auscultation bilaterally Chest: Nontender Abdomen: soft; nondistended; nontender; bowel sounds present Back: Nontender Extremities: No deformity; full range of motion; pulses normal Neurologic: Awake, alert and oriented; motor function intact in all extremities and symmetric; no facial droop Skin: Warm and dry Psychiatric: Normal mood and affect   RESULTS  Summary of this visit's results, reviewed and interpreted by myself:   EKG Interpretation  Date/Time:  Sunday February 08 2021 21:38:53 EDT Ventricular Rate:   91 PR Interval:  200 QRS Duration: 87 QT Interval:  369 QTC Calculation: 454 R Axis:   72 Text Interpretation: Sinus rhythm Biatrial enlargement RSR' in V1 or V2, right VCD or RVH No significant change was found Confirmed by Paula Libra ([id number]) on 02/08/2021 10:41:51 PM      Laboratory Studies: No results found for this or any previous visit (from the past 24 hour(s)). Imaging Studies: CT Maxillofacial Wo Contrast  Result Date: 02/09/2021 CLINICAL DATA:  Facial trauma after a fall. Laceration to the chin and right face. EXAM: CT MAXILLOFACIAL WITHOUT CONTRAST TECHNIQUE: Multidetector CT imaging of the maxillofacial structures was performed. Multiplanar CT image reconstructions were also generated. COMPARISON:  CT head 12/11/2014 FINDINGS: Osseous: Bowing deformity of the right inferior orbital rim into the anterior inferior right lateral orbital rim. Overlying soft tissues are intact and the paranasal sinuses are clear. This may represent an old fracture deformity rather than acute fracture. Correlation with any previous history of orbital trauma is suggested. Nasal bones and facial bones appear otherwise intact. No focal bone lesion or bone destruction. 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 Degenerative changes in the cervical spine. Orbits: The globes and extraocular muscles appear intact and symmetrical. No retrobulbar soft tissue gas or infiltration. No herniation of the extraocular muscles. Sinuses: Paranasal sinuses and mastoid air cells are clear. Soft tissues: Soft tissue swelling/hematoma over the right zygomatic region. Small amount of soft tissue gas consistent with penetrating injury. No periorbital soft tissue hematoma. Limited intracranial: No focal abnormality suggested. IMPRESSION: 1. Bowing deformity of the right inferior orbital rim. Overlying soft tissues are intact and the right maxillary antrum is clear. This is possibly acute but may represent an old  fracture deformity. Correlation with any previous history of orbital trauma is suggested. 2. Soft tissue swelling/hematoma over the right zygomatic region with small amount of soft tissue gas consistent with penetrating injury. 3. No acute orbital or facial fractures identified. Electronically Signed   By: Burman Nieves M.D.   On: 02/09/2021 01:34    ED COURSE and MDM  Nursing notes, initial and subsequent vitals signs, including pulse oximetry, reviewed and interpreted by myself.  Vitals:   02/08/21 2159 02/08/21 2300 02/09/21 0000 02/09/21 0135  BP: (!) 156/99 (!) 169/116 (!) 166/110 (!) 151/99  Pulse: 84 82 87 89  Resp: 16 16 16 19   Temp:      TempSrc:      SpO2: 97% 98% 100% 98%  Weight:      Height:       Medications  lidocaine-EPINEPHrine (XYLOCAINE W/EPI) 2 %-1:100000 (with pres) injection 20 mL (has no administration in time range)  ondansetron (ZOFRAN) injection 4 mg (4 mg Intravenous Given 02/09/21 0036)  sodium chloride 0.9 % bolus 1,000 mL (0 mLs Intravenous Stopped 02/09/21 0157)  lidocaine-EPINEPHrine (XYLOCAINE W/EPI) 2 %-1:100000 (with pres) injection 20 mL (20 mLs Infiltration Given by Other 02/09/21 0137)  LORazepam (ATIVAN) 2 MG/ML injection (2 mg  Given 02/09/21 0225)  The patient and her mother confirm that she did have previous trauma to her right eye as there is no evidence of current trauma at that location.  2:26 AM Patient was being prepared for discharge she had a seizure which is consistent with seizure she has had in the past.  Her mother believes that is due to sleep deprivation as well as the fluorescent lights.  She was given 2 mg of Ativan and we will observe.  3:22 AM Patient's mother comfortable taking her home at this time.  She has had no further seizures.   PROCEDURES  Procedures LACERATION REPAIR Performed by: Carlisle Beers Krist Rosenboom Authorized by: Carlisle Beers Haniyah Maciolek Consent: Verbal consent obtained. Risks and benefits: risks, benefits and alternatives were  discussed Consent given by: patient Patient identity confirmed: provided demographic data Prepped and Draped in normal sterile fashion Wound explored  Laceration Location: Right cheek  Laceration Length: 2 cm  No Foreign Bodies seen or palpated  Anesthesia: local infiltration  Local anesthetic: lidocaine 2% with epinephrine  Anesthetic total: 4 ml  Irrigation method: syringe Amount of cleaning: standard  Skin closure: 5-0 Vicryl Rapide  Number of sutures: 1  Technique: Running  Patient tolerance: Patient tolerated the procedure well with no immediate complications.    LACERATION REPAIR Performed by: Carlisle Beers Kamari Bilek Authorized by: Carlisle Beers Gricelda Foland Consent: Verbal consent obtained. Risks and benefits: risks, benefits and alternatives were discussed Consent given by: patient Patient identity confirmed: provided demographic data Prepped and Draped in normal sterile fashion Wound explored  Laceration Location: Chin  Laceration Length: 2 cm  No Foreign Bodies seen or palpated  Anesthesia: local infiltration  Local anesthetic: lidocaine 2% with epinephrine  Anesthetic total: 4 ml  Irrigation method: syringe Amount of cleaning: standard  Skin closure: 5-0 Vicryl Rapide  Number of sutures: 1  Technique: Running  Patient tolerance: Patient tolerated the procedure well with no immediate complications.     ED DIAGNOSES     ICD-10-CM   1. Syncope and collapse  R55   2. Chin laceration, initial encounter  S01.81XA   3. Laceration of right cheek with complication, initial encounter  S01.411A   4. Facial hematoma, initial encounter  S00.83XA   5. Seizure (HCC)  R56.9        Mouhamadou Gittleman, MD 02/09/21 0220    Paula Libra, MD 02/09/21 [phone]

## 2021-02-09 NOTE — ED Notes (Signed)
Pt mother called out for help. This RN and another to room to assess pt. Pt found to be seizing with right-turned gaze, all 4 extremities jerking and mother attempting to hold her. EDP notified and orders given for Ativan.

## 2021-02-09 NOTE — ED Notes (Signed)
Patient to CT. NAD

## 2021-02-09 NOTE — ED Notes (Signed)
Lido with 2% Epi to bedside

## 2021-02-09 NOTE — ED Notes (Signed)
Discharge papers discussed with mother and father. Vitals taken. Mother reports wanting patient to be discharged home. MD at beside talking to mother and father. PIV removed. Pt placed in wheelchair and discharged with mother and father.

## 2021-02-09 NOTE — ED Notes (Signed)
Chin laceration site, bleeding controlled, site cleaned

## 2021-02-09 NOTE — ED Notes (Signed)
Rt Cheek laceration site irrigated with NS Suture Cart to bedside

## 2021-02-09 NOTE — Discharge Instructions (Signed)
Your stitches should dissolve and about 5 days.  If they are still present after that you may have them removed from by any healthcare professional.

## 2021-03-15 ENCOUNTER — Encounter (INDEPENDENT_AMBULATORY_CARE_PROVIDER_SITE_OTHER): Payer: Self-pay

## 2021-03-22 ENCOUNTER — Other Ambulatory Visit: Payer: Self-pay | Admitting: Neurology

## 2021-03-22 DIAGNOSIS — G40802 Other epilepsy, not intractable, without status epilepticus: Secondary | ICD-10-CM

## 2021-03-23 ENCOUNTER — Telehealth: Payer: Self-pay | Admitting: Neurology

## 2021-03-23 DIAGNOSIS — G40802 Other epilepsy, not intractable, without status epilepticus: Secondary | ICD-10-CM

## 2021-03-23 MED ORDER — LAMOTRIGINE 100 MG PO TABS: ORAL_TABLET | ORAL | 0 refills | Status: DC

## 2021-03-23 NOTE — Telephone Encounter (Signed)
  1. Which medications need to be refilled? (please list name of each medication and dose if known) lamotrigine  2. Which pharmacy/location (including street and city if local pharmacy) is medication to be sent to? [facility] [address] [place]  3. Do they need a 30 day or 90 day supply? 90 day supply

## 2021-03-23 NOTE — Telephone Encounter (Signed)
Refill sent to pharmacy.   

## 2021-03-26 ENCOUNTER — Telehealth: Payer: Self-pay | Admitting: Neurology

## 2021-03-26 NOTE — Telephone Encounter (Signed)
Patient called in stating [facility] in [place] doesn't have the refill of her lamotrigine.

## 2021-03-26 NOTE — Telephone Encounter (Signed)
Pt called and informed that the pharmacy dose have her script and that they are going to fill it for her,

## 2021-04-15 ENCOUNTER — Telehealth: Payer: Self-pay | Admitting: Neurology

## 2021-04-15 NOTE — Telephone Encounter (Signed)
Patient called and said the pharmacy gave her a partial dosage (14 days) for her lamotrigine, special brand Zydus 100 MG.   They had to order it and now the insurance is not allowing her to pick up the rest of the prescription.  She found a pharmacy that carries it. Patient is requesting help with an exception and a new prescription sent to [facility] in [place] on [address]

## 2021-04-16 ENCOUNTER — Other Ambulatory Visit: Payer: Self-pay

## 2021-04-16 DIAGNOSIS — G40802 Other epilepsy, not intractable, without status epilepticus: Secondary | ICD-10-CM

## 2021-04-16 MED ORDER — LAMOTRIGINE 100 MG PO TABS: ORAL_TABLET | ORAL | 0 refills | Status: DC

## 2021-04-16 MED ORDER — LAMOTRIGINE 100 MG PO TABS
ORAL_TABLET | ORAL | 0 refills | Status: AC
Start: 2021-04-16 — End: ?

## 2021-04-16 NOTE — Telephone Encounter (Signed)
Refill sent in for pt. 

## 2021-04-16 NOTE — Telephone Encounter (Signed)
We can start with that and see where it goes, would write on Rx needs to be Zydus manufacturer, pls send new Rx to [facility], thank you!

## 2021-06-23 ENCOUNTER — Other Ambulatory Visit: Payer: Self-pay | Admitting: Neurology

## 2021-06-23 DIAGNOSIS — G40802 Other epilepsy, not intractable, without status epilepticus: Secondary | ICD-10-CM

## 2021-07-20 ENCOUNTER — Ambulatory Visit: Payer: BLUE CROSS/BLUE SHIELD | Admitting: Neurology

## 2021-08-25 DIAGNOSIS — I158 Other secondary hypertension: Secondary | ICD-10-CM | POA: Insufficient documentation

## 2021-11-25 ENCOUNTER — Telehealth: Payer: Self-pay

## 2021-11-25 NOTE — Telephone Encounter (Signed)
NOTES SCANNED TO REFERRAL 

## 2022-02-09 ENCOUNTER — Ambulatory Visit (INDEPENDENT_AMBULATORY_CARE_PROVIDER_SITE_OTHER): Payer: BLUE CROSS/BLUE SHIELD | Admitting: Internal Medicine

## 2022-02-09 ENCOUNTER — Encounter: Payer: Self-pay | Admitting: Internal Medicine

## 2022-02-09 VITALS — HR 78 | Ht 70.5 in | Wt 166.0 lb

## 2022-02-09 DIAGNOSIS — G90A Postural orthostatic tachycardia syndrome (POTS): Secondary | ICD-10-CM

## 2022-02-09 NOTE — Progress Notes (Signed)
? ? ? ? ?ELECTROPHYSIOLOGY CONSULT NOTE  ?Patient ID: Jennifer Galloway, MRN: [id number], DOB/AGE: 31/07/1991 30 y.o. ?Admit date: (Not on file) ?Date of Consult: 02/09/2022 ? ?Primary Physician: Angelica ChessmanAguiar, Rafaela M, MD ?Primary Cardiologist: new ?  ?  ?Jennifer Galloway is a 31 y.o. female who is being seen today for the evaluation of ?POTS at the request of Dr Riley NearingAguiar.  ? ? ?HPI ?Jennifer Galloway is a 31 y.o. female seeking to establish care with cardiology related ? ?She carries a diagnosis of photosensitive epilepsy which apparently started when she was in fourth grade, ?She also carries a diagnosis of POTS from a tilt table test done when she was 31 years old at which time she was started on fludrocortisone and ProAmatine.  Apparently by description, she had a blood pressure of 60 on the tilt table test when they gave her nitroglycerin and then she passed out with convulsions.  She has been seen over time by Dr. Rosiland OzScott Buck  ? ?Does "some stupid stuff" ?  ?She also treats herself with thigh-high compression ? ?She has had recurrent syncope, a note describes an event 3/18 presumed to be vasodepressor as she was wearing a Zio patch at that time without  ? ?No hx of shower intolerance menses intolerance  heat intolerance  ? ?No warning with her episodes and then has a stereotypical behaviour where she she moves through the house, she is found in other rooms on the floor having tried to pull her pants down because she is hot, she is described immediately after these events as being pale but with blue lips.  There is recovery fatigue and sleep which can persist for an hour or 2  ? ?  ? ?Date Cr K Hgb  ?1/23 0.82 3.7 14.2  ?       ? ? ? ? ?Past Medical History:  ?Diagnosis Date  ? Attention deficit disorder 12/11/2012  ? Orthostatic hypotension dysautonomic syndrome   ? Photosensitive reflex epilepsy (HCC)   ? POTS (postural orthostatic tachycardia syndrome)   ? Seizures (HCC)   ?   ? ?Surgical  History:  ?Past Surgical History:  ?Procedure Laterality Date  ? PACEMAKER REMOVAL  05/2019  ?  ? ?Home Meds: ?Current Meds  ?Medication Sig  ? Cholecalciferol (VITAMIN D) 50 MCG (2000 UT) tablet Take 2,000 Units by mouth daily.  ? ferrous sulfate 325 (65 FE) MG tablet Take 325 mg by mouth at bedtime.  ? fludrocortisone (FLORINEF) 0.1 MG tablet Take 0.1 mg by mouth 2 (two) times daily.  ? lamoTRIgine (LAMICTAL) 100 MG tablet Take 1.5 tablets twice a day  ? metoprolol succinate (TOPROL-XL) 25 MG 24 hr tablet Take 25 mg by mouth daily.  ? midodrine (PROAMATINE) 10 MG tablet Take 10 mg by mouth 3 (three) times daily with meals. 4:30, 11, 4  ? promethazine (PHENERGAN) 12.5 MG tablet Take 12.5 mg by mouth every 6 (six) hours as needed.   ? ? ?Allergies:  ?Allergies  ?Allergen Reactions  ? Ciprocinonide [Fluocinolone] Other (See Comments)  ?  Pass Out  ? Dye Fdc Red [Red Dye] Other (See Comments) and Nausea And Vomiting  ?  Allergy to Lamictal dye ?Allergy to Lamictal dye  ? Other Other (See Comments) and Nausea And Vomiting  ?  Allergy to artificial sweetners ?Allergy to artificial sweetners  ? Aspartame Nausea And Vomiting  ? Ciprofloxacin Nausea And Vomiting  ? ? ?Social History  ? ?Socioeconomic History  ? Marital status:  Single  ?  Spouse name: Not on file  ? Number of children: Not on file  ? Years of education: Not on file  ? Highest education level: Not on file  ?Occupational History  ? Not on file  ?Tobacco Use  ? Smoking status: Never  ?  Passive exposure: Never  ? Smokeless tobacco: Never  ?Vaping Use  ? Vaping Use: Never used  ?Substance and Sexual Activity  ? Alcohol use: No  ? Drug use: No  ? Sexual activity: Never  ?  Birth control/protection: Injection  ?Other Topics Concern  ? Not on file  ?Social History Narrative  ? Teaira graduated last year in December from [employer]. 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   ? Lives with her parents. She has adult siblings that do not live  in the home.  ?    ? ?Social Determinants of Health  ? ?Financial Resource Strain: Not on file  ?Food Insecurity: Not on file  ?Transportation Needs: Not on file  ?Physical Activity: Not on file  ?Stress: Not on file  ?Social Connections: Not on file  ?Intimate Partner Violence: Not on file  ?  ? ?Family History  ?Problem Relation Age of Onset  ? Thyroid disease Mother   ? Seizures Mother   ? Cancer Paternal Grandfather   ?     Died at 56  ?  ? ?ROS:  Please see the history of present illness.     All other systems reviewed and negative.  ? ? ?Physical Exam: ?Pulse 78, height 5' 10.5" (1.791 m), weight 166 lb (75.3 kg), SpO2 98 %. ?General: Well developed, well nourished female in no acute distress. ?Head: Normocephalic, atraumatic, sclera non-icteric, no xanthomas, nares are without discharge. ?EENT: normal  ?Lymph Nodes:  none ?Neck: Negative for carotid bruits. JVD not elevated. ?Back:without scoliosis kyphosis ?Lungs: Clear bilaterally to auscultation without wheezes, rales, or rhonchi. Breathing is unlabored. ?Heart: RRR with S1 S2. No  /6 systolic murmur . No rubs, or gallops appreciated. ?Abdomen: Soft, non-tender, non-distended with normoactive bowel sounds. No hepatomegaly. No rebound/guarding. No obvious abdominal masses. ?Msk:  Strength and tone appear normal for age. ?Extremities: No clubbing or cyanosis. No  edema.  Distal pedal pulses are 2+ and equal bilaterally. ?Skin: Warm and Dry ?Neuro: Alert and oriented X 3. CN III-XII intact Grossly normal sensory and motor function . ?Psych:  Responds to questions appropriately with a normal affect. ?  ?  ?  ? ?EKG: sinus @ 76 ?15/09/40 ? ? ?Assessment and Plan:  ?Neurocardiogenic syncope ? ?Photosensitive epilepsy ? ?Hypertensive ? ?Based on the information relayed I am not sure that this patient has POTS.  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 Ace Gins in 3/18.  I do not  understand the relationship of her syncope and a stereotypical behavior wherein she is without awareness or recollection walking around the house and then being found by her family and other rooms around the house.  I wonder whether there may be a secondary seizure associated with a hypotensive event that is associate with a partial complex type of seizure   ? ?Her BP is exceedingly high.  I think her fludrocortisone ProAmatine will ultimately need to be weaned; however, when they have tried to do this in the past, it has been fraught with peril and there is an association in the patient's mind and her mother's  mind that even small changes have been associated with recurrent syncope. ? ?We discussed extensively the issues of dysautonomia, the physiology of orthstasis and positional stress.   The importance of compression, and the relatively less value of calf compression versus thigh and abdomen compression.   ?Given the challenges of trying to understand this history, I have suggested that he consider a referral to the [facility] to try to understand how this works together is a 31 year old lady is significantly limited in her life because of these complex issues which have been ongoing for now more than a decade.  Once that is done, medication adjustments can be made, they may need to be done in hospital [place]. ? ?98 min was spent in care of the patient including the review of records ? ? ? ? ? ?Sherryl Manges ?Okay ?

## 2022-02-09 NOTE — Patient Instructions (Signed)
Medication Instructions:  ?Your physician recommends that you continue on your current medications as directed. Please refer to the Current Medication list given to you today. ? ?*If you need a refill on your cardiac medications before your next appointment, please call your pharmacy* ? ? ?Lab Work: ?None ordered. ? ?If you have labs (blood work) drawn today and your tests are completely normal, you will receive your results only by: ?MyChart Message (if you have MyChart) OR ?A paper copy in the mail ?If you have any lab test that is abnormal or we need to change your treatment, we will call you to review the results. ? ? ?Testing/Procedures: ?None ordered. ? ? ? ?Follow-Up: ?At [facility], you and your health needs are our priority.  As part of our continuing mission to provide you with exceptional heart care, we have created designated Provider Care Teams.  These Care Teams include your primary Cardiologist (physician) and Advanced Practice Providers (APPs -  Physician Assistants and Nurse Practitioners) who all work together to provide you with the care you need, when you need it. ? ?We recommend signing up for the patient portal called "MyChart".  Sign up information is provided on this After Visit Summary.  MyChart is used to connect with patients for Virtual Visits (Telemedicine).  Patients are able to view lab/test results, encounter notes, upcoming appointments, etc.  Non-urgent messages can be sent to your provider as well.   ?To learn more about what you can do with MyChart, go to [email or web address].   ? ?Please seek referral from you PCP for [facility] ? ?Compression wear as discussed by Dr Graciela Husbands ? ?

## 2022-02-25 ENCOUNTER — Encounter: Payer: Self-pay | Admitting: Internal Medicine

## 2022-03-02 ENCOUNTER — Encounter: Payer: Self-pay | Admitting: Internal Medicine

## 2022-05-11 ENCOUNTER — Encounter: Payer: Self-pay | Admitting: Internal Medicine

## 2022-06-15 ENCOUNTER — Ambulatory Visit: Payer: BLUE CROSS/BLUE SHIELD | Admitting: Internal Medicine

## 2022-07-08 ENCOUNTER — Encounter: Payer: Self-pay | Admitting: Internal Medicine

## 2022-07-08 NOTE — Telephone Encounter (Signed)
Per OV note on 02/09/22: Hypertensive Based on the information relayed I am not sure that this patient has POTS.  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 Ace Galloway in 3/18.  I do not understand the relationship of her syncope and a stereotypical behavior wherein she is without awareness or recollection walking around the house and then being found by her family and other rooms around the house.  I wonder whether there may be a secondary seizure associated with a hypotensive event that is associate with a partial complex type of seizure   Her BP is exceedingly high.  I think her fludrocortisone ProAmatine will ultimately need to be weaned; however, when they have tried to do this in the past, it has been fraught with peril and there is an association in the patient's mind and her mother's mind that even small changes have been associated with recurrent syncope.  We discussed extensively the issues of dysautonomia, the physiology of orthstasis and positional stress.   The importance of compression, and the relatively less value of calf compression versus thigh and abdomen compression.   Given the challenges of trying to understand this history, I have suggested that he consider a referral to the [facility] to try to understand how this works together is a 31 year old lady is significantly limited in her life because of these complex issues which have been ongoing for now more than a decade.  Once that is done, medication adjustments can be made, they may need to be done in hospital [place].   Patient seen by Jennifer Flock, MD at [facility] on 05/31/22 and he noted: Based on the history, I agree with Dr. Graciela Galloway that a diagnosis of vasovagal syncope (and presyncope) is more accurate. Having said that, there is often diagnostic and physiologic overlap of these two conditions, both of which can be present at different times in the  same patient, and the treatments for the two have much in common as well. The most important treatments are (1) hydration, including salt supplementation, (2) physical exercise, and (3) compressive garments - and in that regard, the abdominal binder that she is using is ideal (and need not increase pressure beyond 10 mmHg). I have concerns about Jennifer Galloway's elevated blood pressure. Both fludrocortisone and midodrine are likely culprits. The use of fludrocortisone for treatment of POTS and vasovagal syncope has become controversial. The drug has two effects: (1) sodium retention, leading to expansion of intravascular volume, but this effect wears off after a few weeks, and (2) sensitization of peripheral vascular alpha receptors. Fludrocortisone can also lower the serum potassium level (which should be followed periodically).  The benefit of these two drugs, in her case, probably no longer outweighs the potential risk from elevated blood pressure. I would favor tapering them, eventually eliminating the fludrocortisone, then reassessing the need for midodrine, which may be equally effective for her at a reduced dose. I would also suggest waiting for her blood pressure to come down before pushing the dose of salt further. At this time, we will evaluate further with autonomic testing, and I will refer her to our POTS Treatment Program, as I do think that this approach would be very helpful for her.  Will route to Dr Jennifer Galloway to review as it doesn't look like this is the ideal treatment for patient, and seems like this should now be addressed by Dr Jennifer Galloway.

## 2022-07-13 MED ORDER — MIDODRINE HCL 10 MG PO TABS: 10.0000 mg | ORAL_TABLET | Freq: Three times a day (TID) | ORAL | 3 refills | Status: DC

## 2022-08-17 ENCOUNTER — Encounter (HOSPITAL_COMMUNITY): Payer: Self-pay

## 2022-08-17 ENCOUNTER — Emergency Department (HOSPITAL_COMMUNITY)
Admission: EM | Admit: 2022-08-17 | Discharge: 2022-08-17 | Disposition: A | Discharge status: Home / Self Care | Payer: BLUE CROSS/BLUE SHIELD | Attending: Emergency Medicine | Admitting: Emergency Medicine

## 2022-08-17 ENCOUNTER — Other Ambulatory Visit: Payer: Self-pay

## 2022-08-17 ENCOUNTER — Emergency Department (HOSPITAL_COMMUNITY): Payer: BLUE CROSS/BLUE SHIELD

## 2022-08-17 DIAGNOSIS — D72829 Elevated white blood cell count, unspecified: Secondary | ICD-10-CM | POA: Diagnosis not present

## 2022-08-17 DIAGNOSIS — S0181XA Laceration without foreign body of other part of head, initial encounter: Secondary | ICD-10-CM | POA: Insufficient documentation

## 2022-08-17 DIAGNOSIS — Y990 Civilian activity done for income or pay: Secondary | ICD-10-CM | POA: Diagnosis not present

## 2022-08-17 DIAGNOSIS — S0993XA Unspecified injury of face, initial encounter: Secondary | ICD-10-CM | POA: Diagnosis present

## 2022-08-17 DIAGNOSIS — E039 Hypothyroidism, unspecified: Secondary | ICD-10-CM | POA: Insufficient documentation

## 2022-08-17 DIAGNOSIS — S01511A Laceration without foreign body of lip, initial encounter: Secondary | ICD-10-CM | POA: Diagnosis not present

## 2022-08-17 DIAGNOSIS — R55 Syncope and collapse: Secondary | ICD-10-CM | POA: Diagnosis not present

## 2022-08-17 DIAGNOSIS — S01512A Laceration without foreign body of oral cavity, initial encounter: Secondary | ICD-10-CM | POA: Insufficient documentation

## 2022-08-17 DIAGNOSIS — W01198A Fall on same level from slipping, tripping and stumbling with subsequent striking against other object, initial encounter: Secondary | ICD-10-CM | POA: Insufficient documentation

## 2022-08-17 LAB — CBC WITH DIFFERENTIAL/PLATELET
Abs Immature Granulocytes: 0.04 10*3/uL (ref 0.00–0.07)
Basophils Absolute: 0 10*3/uL (ref 0.0–0.1)
Basophils Relative: 0 %
Eosinophils Absolute: 0 10*3/uL (ref 0.0–0.5)
Eosinophils Relative: 0 %
HCT: 43.8 % (ref 36.0–46.0)
Hemoglobin: 14.7 g/dL (ref 12.0–15.0)
Immature Granulocytes: 0 %
Lymphocytes Relative: 9 %
Lymphs Abs: 1.1 10*3/uL (ref 0.7–4.0)
MCH: 29.9 pg (ref 26.0–34.0)
MCHC: 33.6 g/dL (ref 30.0–36.0)
MCV: 89 fL (ref 80.0–100.0)
Monocytes Absolute: 0.9 10*3/uL (ref 0.1–1.0)
Monocytes Relative: 8 %
Neutro Abs: 9.9 10*3/uL — ABNORMAL HIGH (ref 1.7–7.7)
Neutrophils Relative %: 83 %
Platelets: 321 10*3/uL (ref 150–400)
RBC: 4.92 MIL/uL (ref 3.87–5.11)
RDW: 12.7 % (ref 11.5–15.5)
WBC: 12.1 10*3/uL — ABNORMAL HIGH (ref 4.0–10.5)
nRBC: 0 % (ref 0.0–0.2)

## 2022-08-17 LAB — BASIC METABOLIC PANEL
Anion gap: 8 (ref 5–15)
BUN: 8 mg/dL (ref 6–20)
CO2: 29 mmol/L (ref 22–32)
Calcium: 9.9 mg/dL (ref 8.9–10.3)
Chloride: 104 mmol/L (ref 98–111)
Creatinine, Ser: 1.01 mg/dL — ABNORMAL HIGH (ref 0.44–1.00)
GFR, Estimated: >60 mL/min (ref >60)
Glucose, Bld: 103 mg/dL — ABNORMAL HIGH (ref 70–99)
Potassium: 3.5 mmol/L (ref 3.5–5.1)
Sodium: 141 mmol/L (ref 135–145)

## 2022-08-17 LAB — I-STAT BETA HCG BLOOD, ED (MC, WL, AP ONLY): I-stat hCG, quantitative: <5 m[IU]/mL (ref <5)

## 2022-08-17 MED ORDER — ACETAMINOPHEN 500 MG PO TABS
1000.0000 mg | ORAL_TABLET | Freq: Once | ORAL | Status: AC
  Administered 2022-08-17: 1000 mg via ORAL
  Filled 2022-08-17: qty 2

## 2022-08-17 MED ORDER — ACETAMINOPHEN 160 MG/5ML PO SOLN: 650.0000 mg | Freq: Once | ORAL | Status: DC

## 2022-08-17 NOTE — Discharge Instructions (Signed)
Your bruising and lacerations should heal well with time.  Take ibuprofen and Tylenol for pain and soreness.  Utilize ice to minimize swelling.  If areas of wounds become increasingly red, painful, swollen, or begin draining pus, please return to the emergency department.  There is also a telephone number below for a otolaryngologist that you can call for further evaluation of your wounds.  Return to the emergency department at any time for any new or worsening symptoms of concern.

## 2022-08-17 NOTE — ED Provider Triage Note (Signed)
Emergency Medicine Provider Triage Evaluation Note  Earle Burson , a 31 y.o. female  was evaluated in triage.  Pt complains of syncope.  Patient has a history of POTS and has frequent syncopal episodes.  Last syncopal episode a few months ago.  No preceding symptoms.  Denies chest pain or shortness of breath.  Patient fell forward and hit above her right eye.  She has a laceration to tongue. Tetanus shot was within the past 5 years.   Review of Systems  Positive: syncope Negative: CP  Physical Exam  BP (!) 156/114 (BP Location: Right Arm)   Pulse 89   Temp 98.3 F (36.8 C) (Oral)   Resp 16   Ht 5\' 10"  (1.778 m)   Wt 75.3 kg   SpO2 99%   BMI 23.82 kg/m  Gen:   Awake, no distress   Resp:  Normal effort  MSK:   Moves extremities without difficulty  Other:  Laceration to tongue, hematoma above right eye  Medical Decision Making  Medically screening exam initiated at 3:52 PM.  Appropriate orders placed.  Serita Sheller Morneault was informed that the remainder of the evaluation will be completed by another provider, this initial triage assessment does not replace that evaluation, and the importance of remaining in the ED until their evaluation is complete.  CT scans Labs EKG   Suzy Bouchard, [place] 08/17/22 1553

## 2022-08-17 NOTE — ED Triage Notes (Signed)
Patient has hx of POTS and wears abd  compression for POTS.  Patient has HTN which is normal for her.  Patient had syncopal episode at work landing on right side of face.  Patient abrasion and swelling to right side of face and moderate lac in tongue.  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  Dr Cleda Mccreedy is cards.  Lac noted to chin.

## 2022-08-17 NOTE — ED Provider Notes (Signed)
[facility] EMERGENCY DEPARTMENT Provider Note   CSN: [id number] Arrival date & time: 08/17/22  1443     History {Add pertinent medical, surgical, social history, OB history to HPI:1} Chief Complaint  Patient presents with   Loss of Consciousness    Jennifer Galloway is a 31 y.o. female.   Loss of Consciousness Patient presents for syncope.  She has a history of frequent similar episodes.  During today's episode, she fell forward and struck her right eyebrow area.     Home Medications Prior to Admission medications   Medication Sig Start Date End Date Taking? Authorizing Provider  Cholecalciferol (VITAMIN D) 50 MCG (2000 UT) tablet Take 2,000 Units by mouth daily.    [provider]  ferrous sulfate 325 (65 FE) MG tablet Take 325 mg by mouth at bedtime.    [provider]  fludrocortisone (FLORINEF) 0.1 MG tablet Take 0.1 mg by mouth 2 (two) times daily.    [provider]  lamoTRIgine (LAMICTAL) 100 MG tablet Take 1.5 tablets twice a day 04/16/21   Cameron Sprang, MD  metoprolol succinate (TOPROL-XL) 25 MG 24 hr tablet Take 25 mg by mouth daily. 06/03/15 02/09/22  [provider]  midodrine (PROAMATINE) 10 MG tablet Take 1 tablet (10 mg total) by mouth 3 (three) times daily with meals. 4:30, 11, 4 07/13/22   Deboraha Sprang, MD  promethazine (PHENERGAN) 12.5 MG tablet Take 12.5 mg by mouth every 6 (six) hours as needed.  10/12/13   [provider]      Allergies    Ciprocinonide [fluocinolone], Dye fdc red [red dye], Other, Aspartame, and Ciprofloxacin    Review of Systems   Review of Systems  Cardiovascular:  Positive for syncope.    Physical Exam Updated Vital Signs BP (!) 156/114 (BP Location: Right Arm)   Pulse 89   Temp 98.3 F (36.8 C) (Oral)   Resp 16   Ht 5\' 10"  (1.778 m)   Wt 75.3 kg   SpO2 99%   BMI 23.82 kg/m  Physical Exam  ED Results / Procedures / Treatments   Labs (all labs  ordered are listed, but only abnormal results are displayed) Labs Reviewed  CBC WITH DIFFERENTIAL/PLATELET - Abnormal; Notable for the following components:      Result Value   WBC 12.1 (*)    Neutro Abs 9.9 (*)    All other components within normal limits  BASIC METABOLIC PANEL - Abnormal; Notable for the following components:   Glucose, Bld 103 (*)    Creatinine, Ser 1.01 (*)    All other components within normal limits  I-STAT BETA HCG BLOOD, ED ([facility], [facility], [facility] ONLY)    EKG None  Radiology CT Head Wo Contrast  Result Date: 08/17/2022 CLINICAL DATA:  Syncopal episode, fall, landing on right side of face EXAM: CT HEAD WITHOUT CONTRAST CT MAXILLOFACIAL WITHOUT CONTRAST TECHNIQUE: Multidetector CT imaging of the head and maxillofacial structures were performed using the standard protocol without intravenous contrast. Multiplanar CT image reconstructions of the maxillofacial structures were also generated. RADIATION DOSE REDUCTION: This exam was performed according to the departmental dose-optimization program which includes automated exposure control, adjustment of the mA and/or kV according to patient size and/or use of iterative reconstruction technique. COMPARISON:  02/25/2021 CT head, 02/09/2021 CT face FINDINGS: CT HEAD FINDINGS Brain: No evidence of acute infarct, hemorrhage, mass, mass effect, or midline shift. No hydrocephalus or extra-axial fluid collection. Vascular: No hyperdense vessel. Skull: Normal.  Negative for fracture or focal lesion. Other: The mastoid air cells are well aerated. CT MAXILLOFACIAL FINDINGS Osseous: No fracture or mandibular dislocation. No destructive process. Orbits: Redemonstrated inferiorly depressed deformity of the right orbital floor, favored to represent sequela of remote trauma. No acute traumatic or inflammatory finding. Sinuses: Clear. Soft tissues: Large right frontal scalp and periorbital hematoma. IMPRESSION: 1. No acute intracranial process. 2. Large  right frontal scalp and periorbital hematoma. No acute facial fracture. 3. Unchanged inferiorly depressed deformity of the right orbital floor, favored to represent sequela of remote trauma. Electronically Signed   By: Merilyn Baba M.D.   On: 08/17/2022 17:13   CT Maxillofacial Wo Contrast  Result Date: 08/17/2022 CLINICAL DATA:  Syncopal episode, fall, landing on right side of face EXAM: CT HEAD WITHOUT CONTRAST CT MAXILLOFACIAL WITHOUT CONTRAST TECHNIQUE: Multidetector CT imaging of the head and maxillofacial structures were performed using the standard protocol without intravenous contrast. Multiplanar CT image reconstructions of the maxillofacial structures were also generated. RADIATION DOSE REDUCTION: This exam was performed according to the departmental dose-optimization program which includes automated exposure control, adjustment of the mA and/or kV according to patient size and/or use of iterative reconstruction technique. COMPARISON:  02/25/2021 CT head, 02/09/2021 CT face FINDINGS: CT HEAD FINDINGS Brain: No evidence of acute infarct, hemorrhage, mass, mass effect, or midline shift. No hydrocephalus or extra-axial fluid collection. Vascular: No hyperdense vessel. Skull: Normal. Negative for fracture or focal lesion. Other: The mastoid air cells are well aerated. CT MAXILLOFACIAL FINDINGS Osseous: No fracture or mandibular dislocation. No destructive process. Orbits: Redemonstrated inferiorly depressed deformity of the right orbital floor, favored to represent sequela of remote trauma. No acute traumatic or inflammatory finding. Sinuses: Clear. Soft tissues: Large right frontal scalp and periorbital hematoma. IMPRESSION: 1. No acute intracranial process. 2. Large right frontal scalp and periorbital hematoma. No acute facial fracture. 3. Unchanged inferiorly depressed deformity of the right orbital floor, favored to represent sequela of remote trauma. Electronically Signed   By: Merilyn Baba M.D.    On: 08/17/2022 17:13    Procedures Procedures  {Document cardiac monitor, telemetry assessment procedure when appropriate:1}  Medications Ordered in ED Medications - No data to display  ED Course/ Medical Decision Making/ A&P                           Medical Decision Making Risk OTC drugs.   ***  {Document critical care time when appropriate:1} {Document review of labs and clinical decision tools ie heart score, Chads2Vasc2 etc:1}  {Document your independent review of radiology images, and any outside records:1} {Document your discussion with family members, caretakers, and with consultants:1} {Document social determinants of health affecting pt's care:1} {Document your decision making why or why not admission, treatments were needed:1} Final Clinical Impression(s) / ED Diagnoses Final diagnoses:  None    Rx / DC Orders ED Discharge Orders     None

## 2022-08-17 NOTE — ED Notes (Signed)
RN cleaned dried blood on the patient's face. ED Provider applying dermabond at bedside

## 2022-08-18 NOTE — ED Provider Notes (Incomplete)
[facility] EMERGENCY DEPARTMENT Provider Note   CSN: [id number] Arrival date & time: 08/17/22  1443     History {Add pertinent medical, surgical, social history, OB history to HPI:1} Chief Complaint  Patient presents with  . Loss of Consciousness    Jennifer Galloway is a 31 y.o. female.   Loss of Consciousness Patient presents for syncope.  She has a history of frequent similar episodes.  During today's episode, she fell forward and struck her right eyebrow area.  This occurred while at work.  Since her fall, she has had pain, swelling, and bruising to her right periorbital area.  She suffered a tongue bite and lip laceration during this fall as well.  Tetanus is up-to-date.  Patient's prior episodes of syncope have been attributable to POTS.  She has undergone extensive work-up in the past for this.  She is on midodrine and does wear compression stockings.     Home Medications Prior to Admission medications   Medication Sig Start Date End Date Taking? Authorizing Provider  Cholecalciferol (VITAMIN D) 50 MCG (2000 UT) tablet Take 2,000 Units by mouth daily.    [provider]  ferrous sulfate 325 (65 FE) MG tablet Take 325 mg by mouth at bedtime.    [provider]  fludrocortisone (FLORINEF) 0.1 MG tablet Take 0.1 mg by mouth 2 (two) times daily.    [provider]  lamoTRIgine (LAMICTAL) 100 MG tablet Take 1.5 tablets twice a day 04/16/21   Cameron Sprang, MD  metoprolol succinate (TOPROL-XL) 25 MG 24 hr tablet Take 25 mg by mouth daily. 06/03/15 02/09/22  [provider]  midodrine (PROAMATINE) 10 MG tablet Take 1 tablet (10 mg total) by mouth 3 (three) times daily with meals. 4:30, 11, 4 07/13/22   Deboraha Sprang, MD  promethazine (PHENERGAN) 12.5 MG tablet Take 12.5 mg by mouth every 6 (six) hours as needed.  10/12/13   [provider]      Allergies    Ciprocinonide [fluocinolone], Dye fdc red [red dye],  Other, Aspartame, and Ciprofloxacin    Review of Systems   Review of Systems  HENT:  Positive for facial swelling.   Cardiovascular:  Positive for syncope.  Skin:  Positive for wound.  Neurological:  Positive for syncope.  All other systems reviewed and are negative.   Physical Exam Updated Vital Signs BP (!) 156/114 (BP Location: Right Arm)   Pulse 89   Temp 98.3 F (36.8 C) (Oral)   Resp 16   Ht 5\' 10"  (1.778 m)   Wt 75.3 kg   SpO2 99%   BMI 23.82 kg/m  Physical Exam Vitals and nursing note reviewed.  Constitutional:      General: She is not in acute distress.    Appearance: Normal appearance. She is well-developed.  HENT:     Head: Normocephalic.     Right Ear: External ear normal.     Left Ear: External ear normal.     Nose: Nose normal.     Mouth/Throat:     Comments: Tongue lacerations to superior and inferior surfaces of distal tongue.  Abrasion to mucosal surface of lower lip.  Superficial laceration to exterior surface of lower lip. Eyes:     Extraocular Movements: Extraocular movements intact.     Conjunctiva/sclera: Conjunctivae normal.     Comments: Right periorbital swelling  Cardiovascular:     Rate and Rhythm: Normal rate and regular rhythm.  Pulmonary:  Effort: Pulmonary effort is normal. No respiratory distress.  Abdominal:     General: There is no distension.     Palpations: Abdomen is soft.     Tenderness: There is no abdominal tenderness.  Musculoskeletal:        General: No swelling. Normal range of motion.     Cervical back: Neck supple.     Right lower leg: No edema.     Left lower leg: No edema.  Skin:    General: Skin is warm and dry.     Capillary Refill: Capillary refill takes less than 2 seconds.     Coloration: Skin is not jaundiced or pale.  Neurological:     General: No focal deficit present.     Mental Status: She is alert and oriented to person, place, and time.     Cranial Nerves: No cranial nerve deficit.     Sensory:  No sensory deficit.     Motor: No weakness.     Coordination: Coordination normal.  Psychiatric:        Mood and Affect: Affect normal. Mood is anxious.        Speech: Speech normal.        Behavior: Behavior normal. Behavior is cooperative.        Thought Content: Thought content normal.        Judgment: Judgment normal.     ED Results / Procedures / Treatments   Labs (all labs ordered are listed, but only abnormal results are displayed) Labs Reviewed  CBC WITH DIFFERENTIAL/PLATELET - Abnormal; Notable for the following components:      Result Value   WBC 12.1 (*)    Neutro Abs 9.9 (*)    All other components within normal limits  BASIC METABOLIC PANEL - Abnormal; Notable for the following components:   Glucose, Bld 103 (*)    Creatinine, Ser 1.01 (*)    All other components within normal limits  I-STAT BETA HCG BLOOD, ED ([facility], [facility], [facility] ONLY)    EKG None  Radiology CT Head Wo Contrast  Result Date: 08/17/2022 CLINICAL DATA:  Syncopal episode, fall, landing on right side of face EXAM: CT HEAD WITHOUT CONTRAST CT MAXILLOFACIAL WITHOUT CONTRAST TECHNIQUE: Multidetector CT imaging of the head and maxillofacial structures were performed using the standard protocol without intravenous contrast. Multiplanar CT image reconstructions of the maxillofacial structures were also generated. RADIATION DOSE REDUCTION: This exam was performed according to the departmental dose-optimization program which includes automated exposure control, adjustment of the mA and/or kV according to patient size and/or use of iterative reconstruction technique. COMPARISON:  02/25/2021 CT head, 02/09/2021 CT face FINDINGS: CT HEAD FINDINGS Brain: No evidence of acute infarct, hemorrhage, mass, mass effect, or midline shift. No hydrocephalus or extra-axial fluid collection. Vascular: No hyperdense vessel. Skull: Normal. Negative for fracture or focal lesion. Other: The mastoid air cells are well aerated. CT  MAXILLOFACIAL FINDINGS Osseous: No fracture or mandibular dislocation. No destructive process. Orbits: Redemonstrated inferiorly depressed deformity of the right orbital floor, favored to represent sequela of remote trauma. No acute traumatic or inflammatory finding. Sinuses: Clear. Soft tissues: Large right frontal scalp and periorbital hematoma. IMPRESSION: 1. No acute intracranial process. 2. Large right frontal scalp and periorbital hematoma. No acute facial fracture. 3. Unchanged inferiorly depressed deformity of the right orbital floor, favored to represent sequela of remote trauma. Electronically Signed   By: Merilyn Baba M.D.   On: 08/17/2022 17:13   CT Maxillofacial Wo Contrast  Result Date:  08/17/2022 CLINICAL DATA:  Syncopal episode, fall, landing on right side of face EXAM: CT HEAD WITHOUT CONTRAST CT MAXILLOFACIAL WITHOUT CONTRAST TECHNIQUE: Multidetector CT imaging of the head and maxillofacial structures were performed using the standard protocol without intravenous contrast. Multiplanar CT image reconstructions of the maxillofacial structures were also generated. RADIATION DOSE REDUCTION: This exam was performed according to the departmental dose-optimization program which includes automated exposure control, adjustment of the mA and/or kV according to patient size and/or use of iterative reconstruction technique. COMPARISON:  02/25/2021 CT head, 02/09/2021 CT face FINDINGS: CT HEAD FINDINGS Brain: No evidence of acute infarct, hemorrhage, mass, mass effect, or midline shift. No hydrocephalus or extra-axial fluid collection. Vascular: No hyperdense vessel. Skull: Normal. Negative for fracture or focal lesion. Other: The mastoid air cells are well aerated. CT MAXILLOFACIAL FINDINGS Osseous: No fracture or mandibular dislocation. No destructive process. Orbits: Redemonstrated inferiorly depressed deformity of the right orbital floor, favored to represent sequela of remote trauma. No acute  traumatic or inflammatory finding. Sinuses: Clear. Soft tissues: Large right frontal scalp and periorbital hematoma. IMPRESSION: 1. No acute intracranial process. 2. Large right frontal scalp and periorbital hematoma. No acute facial fracture. 3. Unchanged inferiorly depressed deformity of the right orbital floor, favored to represent sequela of remote trauma. Electronically Signed   By: Merilyn Baba M.D.   On: 08/17/2022 17:13    Procedures Procedures  {Document cardiac monitor, telemetry assessment procedure when appropriate:1}  Medications Ordered in ED Medications - No data to display  ED Course/ Medical Decision Making/ A&P                           Medical Decision Making Risk OTC drugs.   This patient presents to the ED for concern of syncope and fall, this involves an extensive number of treatment options, and is a complaint that carries with it a high risk of complications and morbidity.  The differential diagnosis includes orthostatic hypotension, vasovagal episode, arrhythmia, dehydration, acute injuries   Co morbidities that complicate the patient evaluation  ***   Additional history obtained:  Additional history obtained from *** External records from outside source obtained and reviewed including ***   Lab Tests:  I Ordered, and personally interpreted labs.  The pertinent results include:  ***   Imaging Studies ordered:  I ordered imaging studies including ***  I independently visualized and interpreted imaging which showed *** I agree with the radiologist interpretation   Cardiac Monitoring: / EKG:  The patient was maintained on a cardiac monitor.  I personally viewed and interpreted the cardiac monitored which showed an underlying rhythm of: ***   Consultations Obtained:  I requested consultation with the ***,  and discussed lab and imaging findings as well as pertinent plan - they recommend: ***   Problem List / ED Course / Critical interventions  / Medication management  *** I ordered medication including ***  for ***  Reevaluation of the patient after these medicines showed that the patient {resolved/improved/worsened:23923::"improved"} I have reviewed the patients home medicines and have made adjustments as needed   Social Determinants of Health:  ***   Test / Admission - Considered:  ***   {Document critical care time when appropriate:1} {Document review of labs and clinical decision tools ie heart score, Chads2Vasc2 etc:1}  {Document your independent review of radiology images, and any outside records:1} {Document your discussion with family members, caretakers, and with consultants:1} {Document social determinants of health affecting pt's  care:1} {Document your decision making why or why not admission, treatments were needed:1} Final Clinical Impression(s) / ED Diagnoses Final diagnoses:  None    Rx / DC Orders ED Discharge Orders     None

## 2022-08-19 ENCOUNTER — Ambulatory Visit: Payer: BLUE CROSS/BLUE SHIELD | Admitting: Internal Medicine

## 2022-09-20 ENCOUNTER — Telehealth: Payer: Self-pay | Admitting: Internal Medicine

## 2022-09-20 NOTE — Telephone Encounter (Signed)
Pt calling requesting a refill on metoprolol. This medication has not been refilled since 2016. Would Dr. Graciela Husbands like to refill this medication? Please address

## 2022-09-20 NOTE — Telephone Encounter (Signed)
Attempted phone call to pt and left voicemail message to contact office at [phone] and will send a MyChart message for further information.

## 2022-09-20 NOTE — Telephone Encounter (Signed)
*  STAT* If patient is at the pharmacy, call can be transferred to refill team.   1. Which medications need to be refilled? (please list name of each medication and dose if known) metoprolol succinate (TOPROL-XL) 25 MG 24 hr tablet (Expired)   2. Which pharmacy/location (including street and city if local pharmacy) is medication to be sent to?  [facility] - [place], [place] - [address]     3. Do they need a 30 day or 90 day supply? 30

## 2022-09-21 MED ORDER — METOPROLOL SUCCINATE ER 25 MG PO TB24: 25.0000 mg | ORAL_TABLET | Freq: Every day | ORAL | 3 refills | Status: DC

## 2022-09-21 NOTE — Addendum Note (Signed)
Addended by: Alois Cliche on: 09/21/2022 08:15 AM   Modules accepted: Orders

## 2022-09-22 NOTE — Progress Notes (Deleted)
Cardiology Office Note:    Date:  09/22/2022   ID:  Jennifer Galloway, DOB [birth year], MRN [id number]  PCP:  Angelica Chessman, MD  [facility] Providers Cardiologist:  None { Click to update primary MD,subspecialty MD or APP then REFRESH:1}  *** Referring MD: Angelica Chessman, MD   Chief Complaint:  No chief complaint on file. {Click here for Visit Info    :1}    History of Present Illness:   Jennifer Galloway is a 31 y.o. female with  history of POTS diagnosed at 31 yr old with TTT treated with fludrocortisone and ProAmatine, thigh high compression, photosensitive epilepsy.  Patient saw Dr. Graciela Husbands 02/09/22 and BP was very high. Felt she had to wean down on fludrocortisone. Referral to [facility] clinic recommended. She had an autonomic reflex screen 09/01/22 in [place] and no evidence of autonomic failure but evidence of asymptomatic orthostatic tachycardia  She had an episode of syncope 08/2022 and was evaluated by neuro and felt to have convulsive syncope and not epileptic seizure. Hydration was recommended.   Records reviewed in detail. Ms. Buswell reports that she had a syncopal episode while at work. On her second day at a new job, she was eating, was seated, felt fine, and the next thing she knew she was regaining consciousness. There was no postictal confusion. Zoloft was added last week to reduce anxiety, which has been thought to be a trigger.  Bystanders told emergency personnel that she had convulsions while on the floor lasting about a minute. She lacerated the tip of her tongue. Blood pressure was 170/P and 160/90, heart rate in the 90s. EKG showed sinus rhythm.      Past Medical History:  Diagnosis Date   Attention deficit disorder 12/11/2012   Orthostatic hypotension dysautonomic syndrome    Photosensitive reflex epilepsy (HCC)    POTS (postural orthostatic tachycardia syndrome)    Seizures (HCC)    Current Medications: No outpatient  medications have been marked as taking for the 10/06/22 encounter (Appointment) with Dyann Kief, PA-C.    Allergies:   Ciprocinonide [fluocinolone], Dye fdc red [red dye], Other, Aspartame, and Ciprofloxacin   Social History   Tobacco Use   Smoking status: Never    Passive exposure: Never   Smokeless tobacco: Never  Vaping Use   Vaping Use: Never used  Substance Use Topics   Alcohol use: No   Drug use: No    Family Hx: The patient's family history includes Cancer in her paternal grandfather; Seizures in her mother; Thyroid disease in her mother.  ROS     Physical Exam:    VS:  There were no vitals taken for this visit.    Wt Readings from Last 3 Encounters:  08/17/22 166 lb (75.3 kg)  02/09/22 166 lb (75.3 kg)  02/08/21 155 lb (70.3 kg)    Physical Exam  GEN: Well nourished, well developed, in no acute distress  HEENT: normal  Neck: no JVD, carotid bruits, or masses Cardiac:RRR; no murmurs, rubs, or gallops  Respiratory:  clear to auscultation bilaterally, normal work of breathing GI: soft, nontender, nondistended, + BS Ext: without cyanosis, clubbing, or edema, Good distal pulses bilaterally MS: no deformity or atrophy  Skin: warm and dry, no rash Neuro:  Alert and Oriented x 3, Strength and sensation are intact Psych: euthymic mood, full affect        EKGs/Labs/Other Test Reviewed:    EKG:  EKG is *** ordered today.  The  ekg ordered today demonstrates ***  Recent Labs: 08/17/2022: BUN 8; Creatinine, Ser 1.01; Hemoglobin 14.7; Platelets 321; Potassium 3.5; Sodium 141   Recent Lipid Panel No results for input(s): "CHOL", "TRIG", "HDL", "VLDL", "LDLCALC", "LDLDIRECT" in the last 8760 hours.   Prior CV Studies: {Select studies to display:26339}  Autonomic reflex screen 09/01/22 [facility] Comments on Tilt: Patient was tilted for 10 minutes. Orthostatic  hypotension was not detected. Heart rate response was excessive. Average  head-up heart rate was  47 beats/min above baseline. The patient reported  no symptoms.     Risk Assessment/Calculations/Metrics:   {Does this patient have ATRIAL FIBRILLATION?[phone]}     No BP recorded.  {Refresh Note OR Click here to enter BP  :1}***    ASSESSMENT & PLAN:   No problem-specific Assessment & Plan notes found for this encounter.  POTS:  Hydration. It is important not to become dehydrated. Drinking plenty of fluids maintains blood volume, which helps to sustain blood flow to the brain. It is common to be relatively dehydrated after a night's sleep. Drinking a large glass of water (16 oz) first thing in the morning or extra fluids with breakfast can help to restore hydration. During the day, try to drink 36 to 48 ounces of water. Limit drinks containing alcohol, caffeine, and sugar. If you develop fever, diarrhea, vomiting, or heavy sweating, or if your urine appears dark, replace fluids lost with extra fluid. When spending a lot of time outdoors in hot weather, and before a heavy workout, drink extra water as well as low-calorie sports beverages which replace your electrolytes. Take extra sodium (for example, table salt, sports beverages, broth, or thermotabs) which increase intravascular (blood) volume. Respond to warning symptoms. If you feel that you are about to faint, you may be able to prevent syncope by: Sitting down and waiting for symptoms to pass. It may be helpful to squat like a baseball catcher. Lying down with your knees bent or your feet raised. Moving or tensing your legs, which contracts muscles that squeeze venous blood back to the heart, restoring blood flow to the brain. Avoid triggering factors, if they are known, but they are not always.  If syncope occurs and someone is available to help, it is important to be lying down (not upright) with your legs raised a few inches. This helps restore blood flow to the brain.  HTN due to meds-per neuro need to tolerate some degree  of HTN as opposed to hypotension.       {Are you ordering a CV Procedure (e.g. stress test, cath, DCCV, TEE, etc)?   Press F2        :[id number]}   Dispo:  No follow-ups on file.   Medication Adjustments/Labs and Tests Ordered: Current medicines are reviewed at length with the patient today.  Concerns regarding medicines are outlined above.  Tests Ordered: No orders of the defined types were placed in this encounter.  Medication Changes: No orders of the defined types were placed in this encounter.  Signed, Jacolyn Reedy, PA-C  09/22/2022 8:03 AM    [facility] [address] [place], [place], [place]  [address] Phone: [phone]; Fax: [phone]

## 2022-10-06 ENCOUNTER — Ambulatory Visit: Payer: BLUE CROSS/BLUE SHIELD | Admitting: Physician Assistant

## 2022-10-07 NOTE — Progress Notes (Signed)
Office Visit    Patient Name: Jennifer Galloway Date of Encounter: 10/07/2022  Primary Care Provider:  Angelica Chessman, MD Primary Cardiologist:  None Primary Electrophysiologist: None  Chief Complaint    Jennifer Galloway is a 30 y.o. female with PMH of photosensitive epilepsy, POTS, seizures, ADD, orthostatic hypotension this autonomic syndrome who presents today for 55-month follow-up.  Past Medical History    Past Medical History:  Diagnosis Date   Attention deficit disorder 12/11/2012   Orthostatic hypotension dysautonomic syndrome    Photosensitive reflex epilepsy (HCC)    POTS (postural orthostatic tachycardia syndrome)    Seizures (HCC)    Past Surgical History:  Procedure Laterality Date   PACEMAKER REMOVAL  05/2019    Allergies  Allergies  Allergen Reactions   Ciprocinonide [Fluocinolone] Other (See Comments)    Pass Out   Dye Fdc Red [Red Dye] Other (See Comments) and Nausea And Vomiting    Allergy to Lamictal dye Allergy to Lamictal dye   Other Other (See Comments) and Nausea And Vomiting    Allergy to artificial sweetners Allergy to artificial sweetners   Aspartame Nausea And Vomiting   Ciprofloxacin Nausea And Vomiting    History of Present Illness    Jennifer Galloway  is a 31 year old female with the above mention past medical history who presents today for 15-month follow-up of POTS.  She was initially seen by Dr. Graciela Husbands on 02/09/2022 to establish cardiac care.  She was diagnosed with POTS at age 33 after undergoing positive tilt table test and blood pressures dropping to 60 following administration of nitroglycerin with syncope and convulsions. She was started on fludrocortisone and ProAmatine.  She is also using thigh-high compression stockings and currently denies shower intolerance, heat intolerance, or menses intolerance.  Dr. Graciela Husbands felt that patient did not have POTS but may be suffering from possible vasovagal syncope and  presyncope.  She was referred to the [facility] for second opinion and was seen by Dr. Floydene Flock.  He did conclude that both physiologic overlap can occur in the same person at different times.  He endorsed hydration inclusive of salt supplementation, physical exercise and compression garments with pressure not exceeding 10 mmHg in abdominal binder.  He also favored tapering fludrocortisone and midodrine.  Pt was told that stress may be her trigger for POTS/neurocardiogenic syncope. She was seen recently in the ED on 08/17/2022 after suffering a syncopal episode and suffered right-sided facial abrasion.   Jennifer Galloway presents today for post ED follow-up alone..  Since last being seen in the office patient reports that she is doing much better and has not experienced any more syncopal episodes since the ED visit.  She is currently compliant with her medications and denies any adverse reactions.  She reports that since her previous visit she has not made any changes to her Florinef or midodrine.  During today's visit her blood pressures were initially elevated at 134/96 and on second check were 136/88.  She is currently working at [employer] home and experienced her latest syncopal episode there during lunch.  She is currently compliant with waist high compression and states that this is really helped with relieving symptoms.  She attempted titrating her midodrine to 10, 5, 10 and states that she experienced a syncopal event.  She is curious to know if possibly anxiety plays a role in her condition.  She states that while filling her pillbox she usually feels like she is going to  have a syncope attack.  Patient denies chest pain, palpitations, dyspnea, PND, orthopnea, nausea, vomiting, dizziness, syncope, edema, weight gain, or early satiety.  Home Medications    Current Outpatient Medications  Medication Sig Dispense Refill   Cholecalciferol (VITAMIN D) 50 MCG (2000 UT) tablet Take 2,000 Units by  mouth daily.     ferrous sulfate 325 (65 FE) MG tablet Take 325 mg by mouth at bedtime.     fludrocortisone (FLORINEF) 0.1 MG tablet Take 0.1 mg by mouth 2 (two) times daily.     lamoTRIgine (LAMICTAL) 100 MG tablet Take 1.5 tablets twice a day 270 tablet 0   metoprolol succinate (TOPROL-XL) 25 MG 24 hr tablet Take 1 tablet (25 mg total) by mouth daily. 90 tablet 3   midodrine (PROAMATINE) 10 MG tablet Take 1 tablet (10 mg total) by mouth 3 (three) times daily with meals. 4:30, 11, 4 270 tablet 3   promethazine (PHENERGAN) 12.5 MG tablet Take 12.5 mg by mouth every 6 (six) hours as needed.      No current facility-administered medications for this visit.     Review of Systems  Please see the history of present illness.    (+) Anxiety (+) Dizziness  All other systems reviewed and are otherwise negative except as noted above.  Physical Exam    Wt Readings from Last 3 Encounters:  08/17/22 166 lb (75.3 kg)  02/09/22 166 lb (75.3 kg)  02/08/21 155 lb (70.3 kg)   DT:OIZTI were no vitals filed for this visit.,There is no height or weight on file to calculate BMI.  Constitutional:      Appearance: Healthy appearance. Not in distress.  Neck:     Vascular: JVD normal.  Pulmonary:     Effort: Pulmonary effort is normal.     Breath sounds: No wheezing. No rales. Diminished in the bases Cardiovascular:     Normal rate. Regular rhythm. Normal S1. Normal S2.      Murmurs: There is no murmur.  Edema:    Peripheral edema absent.  Abdominal:     Palpations: Abdomen is soft non tender. There is no hepatomegaly.  Skin:    General: Skin is warm and dry.  Neurological:     General: No focal deficit present.     Mental Status: Alert and oriented to person, place and time.     Cranial Nerves: Cranial nerves are intact.  EKG/LABS/Other Studies Reviewed    ECG personally reviewed by me today -none completed today    Lab Results  Component Value Date   WBC 12.1 (H) 08/17/2022   HGB 14.7  08/17/2022   HCT 43.8 08/17/2022   MCV 89.0 08/17/2022   PLT 321 08/17/2022   Lab Results  Component Value Date   CREATININE 1.01 (H) 08/17/2022   BUN 8 08/17/2022   NA 141 08/17/2022   K 3.5 08/17/2022   CL 104 08/17/2022   CO2 29 08/17/2022   Lab Results  Component Value Date   ALT 13 12/12/2014   AST 17 12/12/2014   ALKPHOS 77 12/12/2014   BILITOT 0.8 12/12/2014   No results found for: "CHOL", "HDL", "LDLCALC", "LDLDIRECT", "TRIG", "CHOLHDL"  No results found for: "HGBA1C"  Assessment & Plan    1.  POTS: -Patient recently had syncopal event 08/2022 and suffered facial lacerations. -She is currently on midodrine 10 mg 3 times daily and fludrocortisone 0.1 mg twice daily -She will follow-up with Dr. Graciela Husbands regarding titration of current medication regimen.  2.  Syncope and collapse: -Today patient reports no dizziness or syncopal events since ED visit.  Disposition: Follow-up with None or APP in as scheduled months    Medication Adjustments/Labs and Tests Ordered: Current medicines are reviewed at length with the patient today.  Concerns regarding medicines are outlined above.   Signed, Napoleon Form, Leodis Rains, NP 10/07/2022, 11:41 AM [facility]  Note:  This document was prepared using Dragon voice recognition software and may include unintentional dictation errors.

## 2022-10-08 ENCOUNTER — Ambulatory Visit: Payer: BLUE CROSS/BLUE SHIELD | Attending: Physician Assistant | Admitting: Nurse Practitioner

## 2022-10-08 ENCOUNTER — Encounter: Payer: Self-pay | Admitting: Nurse Practitioner

## 2022-10-08 VITALS — BP 136/88 | HR 74 | Ht 70.0 in | Wt 166.4 lb

## 2022-10-08 DIAGNOSIS — G90A Postural orthostatic tachycardia syndrome (POTS): Secondary | ICD-10-CM

## 2022-10-08 DIAGNOSIS — R55 Syncope and collapse: Secondary | ICD-10-CM | POA: Diagnosis not present

## 2022-10-08 NOTE — Patient Instructions (Addendum)
Medication Instructions:  Your physician recommends that you continue on your current medications as directed. Please refer to the Current Medication list given to you today. *If you need a refill on your cardiac medications before your next appointment, please call your pharmacy*  Lab Work: None Ordered  Testing/Procedures: None Ordered  Follow-Up: At [facility], you and your health needs are our priority.  As part of our continuing mission to provide you with exceptional heart care, we have created designated Provider Care Teams.  These Care Teams include your primary Cardiologist (physician) and Advanced Practice Providers (APPs -  Physician Assistants and Nurse Practitioners) who all work together to provide you with the care you need, when you need it.  We recommend signing up for the patient portal called "MyChart".  Sign up information is provided on this After Visit Summary.  MyChart is used to connect with patients for Virtual Visits (Telemedicine).  Patients are able to view lab/test results, encounter notes, upcoming appointments, etc.  Non-urgent messages can be sent to your provider as well.   To learn more about what you can do with MyChart, go to [email or web address].    Your next appointment:   First available   The format for your next appointment:   In Person  Provider:   Duke Salvia, MD   Other Instructions   Important Information About Sugar

## 2022-10-20 ENCOUNTER — Encounter: Payer: Self-pay | Admitting: Internal Medicine

## 2022-10-20 ENCOUNTER — Ambulatory Visit: Payer: BLUE CROSS/BLUE SHIELD | Attending: Internal Medicine | Admitting: Internal Medicine

## 2022-10-20 VITALS — BP 143/113 | HR 110 | Ht 70.0 in | Wt 166.0 lb

## 2022-10-20 DIAGNOSIS — G90A Postural orthostatic tachycardia syndrome (POTS): Secondary | ICD-10-CM

## 2022-10-20 DIAGNOSIS — R55 Syncope and collapse: Secondary | ICD-10-CM | POA: Diagnosis not present

## 2022-10-20 NOTE — Patient Instructions (Signed)
Medication Instructions:  Your physician recommends that you continue on your current medications as directed. Please refer to the Current Medication list given to you today.  *If you need a refill on your cardiac medications before your next appointment, please call your pharmacy*   Lab Work: None ordered.  If you have labs (blood work) drawn today and your tests are completely normal, you will receive your results only by: MyChart Message (if you have MyChart) OR A paper copy in the mail If you have any lab test that is abnormal or we need to change your treatment, we will call you to review the results.   Testing/Procedures: None ordered.    Follow-Up: At [facility], you and your health needs are our priority.  As part of our continuing mission to provide you with exceptional heart care, we have created designated Provider Care Teams.  These Care Teams include your primary Cardiologist (physician) and Advanced Practice Providers (APPs -  Physician Assistants and Nurse Practitioners) who all work together to provide you with the care you need, when you need it.  We recommend signing up for the patient portal called "MyChart".  Sign up information is provided on this After Visit Summary.  MyChart is used to connect with patients for Virtual Visits (Telemedicine).  Patients are able to view lab/test results, encounter notes, upcoming appointments, etc.  Non-urgent messages can be sent to your provider as well.   To learn more about what you can do with MyChart, go to [email or web address].    Your next appointment:   6 months with Dr Klein  Important Information About Sugar       

## 2022-10-20 NOTE — Progress Notes (Signed)
Patient Care Team: Angelica Chessman, MD as PCP - General (Family Medicine) Van Clines, MD as Consulting Physician (Neurology) Duke Salvia, MD as Consulting Physician (Cardiology)   HPI  Jennifer Galloway is a 31 y.o. female seen in follow-up for unusual spells, not clearly consistent with POTS for which I was concerned about seizures.  In the with referral to [facility] and seen by Dr. Cheshire>> autonomic testing was "consistent with POTS "and participated in the POTS treatment program with significant improvement as per his note 10/23.  Also has what he describes as convulsive seizure.  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  She is using salt and water but only a tune of about 1 g/day, some compression, using a 1 piece which she finds not too difficult in terms of urination.  Not doing stupid stuff.  Records and Results Reviewed   Past Medical History:  Diagnosis Date   Attention deficit disorder 12/11/2012   Orthostatic hypotension dysautonomic syndrome    Photosensitive reflex epilepsy (HCC)    POTS (postural orthostatic tachycardia syndrome)    Seizures (HCC)     Past Surgical History:  Procedure Laterality Date   PACEMAKER REMOVAL  05/2019    Current Meds  Medication Sig   Cholecalciferol (VITAMIN D) 50 MCG (2000 UT) tablet Take 2,000 Units by mouth daily.   ferrous sulfate 325 (65 FE) MG tablet Take 325 mg by mouth at bedtime.   fludrocortisone (FLORINEF) 0.1 MG tablet Take 0.1 mg by mouth 2 (two) times daily.   lamoTRIgine (LAMICTAL) 100 MG tablet Take 1.5 tablets twice a day   metoprolol succinate (TOPROL-XL) 25 MG 24 hr tablet Take 1 tablet (25 mg total) by mouth  daily.   midodrine (PROAMATINE) 10 MG tablet Take 1 tablet (10 mg total) by mouth 3 (three) times daily with meals. 4:30, 11, 4   promethazine (PHENERGAN) 12.5 MG tablet Take 12.5 mg by mouth every 6 (six) hours as needed.    sertraline (ZOLOFT) 25 MG tablet Take 1 tablet by mouth daily.    Allergies  Allergen Reactions   Ciprocinonide [Fluocinolone] Other (See Comments)    Pass Out   Dye Fdc Red [Red Dye] Other (See Comments) and Nausea And Vomiting    Allergy to Lamictal dye Allergy to Lamictal dye   Other Other (See Comments) and Nausea And Vomiting    Allergy to artificial sweetners Allergy to artificial sweetners   Aspartame Nausea And Vomiting   Ciprofloxacin Nausea And Vomiting      Review of Systems negative except from HPI and PMH  Physical Exam BP (!) 143/113 Comment: standing x66m  Pulse (!) 110   Ht 5\' 10"  (1.778 m)   Wt 166 lb (75.3 kg)   LMP 10/07/2022   SpO2 98%   BMI 23.82 kg/m  Well developed and well nourished in no acute distress HENT normal E scleral and icterus clear Neck Supple JVP flat; carotids brisk and full Clear to ausculation Regular rate and rhythm, no murmurs gallops or rub Soft with active bowel sounds No clubbing cyanosis  Edema Alert and oriented, grossly normal motor and sensory function Skin Warm and Dry  ECG sinus @ 84 16/08/37 Q waves V1 -  V2   CrCl cannot be calculated (Patient's most recent lab result is older than the maximum 21 days allowed.).    Assessment and Plan:  Neurocardiogenic syncope   Photosensitive epilepsy   Hypertensive   The patient was seen at [facility] confirming a diagnosis of neurocardiogenic syncope but also implicating POTS is a component.  A good goal of permissive hypertension was suggested which is I think reasonable to try to mitigate the episodes, her syncope has, with very little prodrome and has caused her harm.  We discussed again the importance of recognizing prodrome and stressed the  importance of salt and water repletion even more aggressively as well as compression to try to give her sufficient time with her prodrome to protect herself by becoming flat.      Current medicines are reviewed at length with the patient today .  The patient does not  have concerns regarding medicines.

## 2022-11-25 ENCOUNTER — Telehealth: Payer: Self-pay | Admitting: Internal Medicine

## 2022-11-25 NOTE — Telephone Encounter (Signed)
Patient will be dropping of paperwork, that she will need to be filled out by the dr. Please advise

## 2022-12-01 ENCOUNTER — Encounter: Payer: Self-pay | Admitting: Internal Medicine

## 2022-12-03 NOTE — Telephone Encounter (Signed)
[place] paperwork completed by Dr Caryl Comes and placed at front desk for pt pick up.

## 2023-03-02 ENCOUNTER — Encounter: Payer: Self-pay | Admitting: Internal Medicine

## 2023-04-08 ENCOUNTER — Other Ambulatory Visit: Payer: Self-pay

## 2023-04-08 ENCOUNTER — Encounter: Payer: Self-pay | Admitting: Internal Medicine

## 2023-04-08 MED ORDER — FLUDROCORTISONE ACETATE 0.1 MG PO TABS: 0.1000 mg | ORAL_TABLET | Freq: Two times a day (BID) | ORAL | 0 refills | Status: DC

## 2023-04-08 NOTE — Telephone Encounter (Signed)
Pt called regarding a refill on a medication that is not prescribed by Dr. Graciela Husbands. Pt stated she is no longer seeing the cardiologist that prescribes this medication, and would like for Dr. Graciela Husbands to manage this medicine. Pt states she will be out of this medication this Sunday and would like it to be sent to [facility] at [address] in [place]. Please address thank you.

## 2023-04-19 ENCOUNTER — Other Ambulatory Visit: Payer: Self-pay | Admitting: Internal Medicine

## 2023-04-27 ENCOUNTER — Ambulatory Visit: Payer: BLUE CROSS/BLUE SHIELD | Attending: Internal Medicine | Admitting: Internal Medicine

## 2023-04-27 ENCOUNTER — Encounter: Payer: Self-pay | Admitting: Internal Medicine

## 2023-04-27 VITALS — BP 148/108 | HR 94 | Ht 70.0 in | Wt 169.2 lb

## 2023-04-27 DIAGNOSIS — G90A Postural orthostatic tachycardia syndrome (POTS): Secondary | ICD-10-CM | POA: Diagnosis not present

## 2023-04-27 LAB — BASIC METABOLIC PANEL
BUN/Creatinine Ratio: 11 (ref 9–23)
BUN: 11 mg/dL (ref 6–20)
CO2: 27 mmol/L (ref 20–29)
Calcium: 9.9 mg/dL (ref 8.7–10.2)
Chloride: 101 mmol/L (ref 96–106)
Creatinine, Ser: 1.01 mg/dL — ABNORMAL HIGH (ref 0.57–1.00)
Glucose: 84 mg/dL (ref 70–99)
Potassium: 3.9 mmol/L (ref 3.5–5.2)
Sodium: 141 mmol/L (ref 134–144)
eGFR: 76 mL/min/{1.73_m2} (ref >59)

## 2023-04-27 MED ORDER — MIDODRINE HCL 10 MG PO TABS: 10.0000 mg | ORAL_TABLET | Freq: Three times a day (TID) | ORAL | 3 refills | Status: DC

## 2023-04-27 NOTE — Patient Instructions (Signed)
Medication Instructions:  Your physician recommends that you continue on your current medications as directed. Please refer to the Current Medication list given to you today.  *If you need a refill on your cardiac medications before your next appointment, please call your pharmacy*   Lab Work: Today: BMET If you have labs (blood work) drawn today and your tests are completely normal, you will receive your results only by: MyChart Message (if you have MyChart) OR A paper copy in the mail If you have any lab test that is abnormal or we need to change your treatment, we will call you to review the results.   Testing/Procedures: None ordered   Follow-Up: At [facility], you and your health needs are our priority.  As part of our continuing mission to provide you with exceptional heart care, we have created designated Provider Care Teams.  These Care Teams include your primary Cardiologist (physician) and Advanced Practice Providers (APPs -  Physician Assistants and Nurse Practitioners) who all work together to provide you with the care you need, when you need it.   Your next appointment:   6 month(s)  The format for your next appointment:   In Person  Provider:   Sherryl Manges, MD{    Thank you for choosing [facility]!!   [phone]  Other Instructions

## 2023-04-27 NOTE — Progress Notes (Signed)
Patient Care Team: Angelica Chessman, MD as PCP - General (Family Medicine) Van Clines, MD as Consulting Physician (Neurology) Duke Salvia, MD as Consulting Physician (Cardiology)   HPI  Jennifer Galloway is a 32 y.o. female seen in follow-up for unusual spells, not clearly consistent with POTS for which I was concerned about seizures.  In the with referral to [facility] and seen by Dr. Cheshire>> autonomic testing was "consistent with POTS "and participated in the POTS treatment program with significant improvement as per his note 10/23.  Also has what he describes as convulsive seizure.  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  There have been no palpitations, lightheadedness or syncope.     Past Medical History:  Diagnosis Date   Attention deficit disorder 12/11/2012   Orthostatic hypotension dysautonomic syndrome    Photosensitive reflex epilepsy (HCC)    POTS (postural orthostatic tachycardia syndrome)    Seizures (HCC)     Past Surgical History:  Procedure Laterality Date   PACEMAKER REMOVAL  05/2019    Current Meds  Medication Sig   Cholecalciferol (VITAMIN D) 50 MCG (2000 UT) tablet Take 2,000 Units by mouth daily.   ferrous sulfate 325 (65 FE) MG tablet Take 325 mg by mouth at bedtime.   fludrocortisone (FLORINEF) 0.1 MG tablet Take 1 tablet (0.1 mg total) by mouth 2 (two) times daily.   lamoTRIgine (LAMICTAL) 100 MG tablet Take 1.5 tablets twice a day   metoprolol succinate (TOPROL-XL) 25 MG 24 hr tablet Take 1 tablet (25 mg total) by mouth daily.   midodrine (PROAMATINE) 10 MG tablet TAKE 1 TABLET BY MOUTH THREE TIMES DAILY WITH MEALS   promethazine  (PHENERGAN) 12.5 MG tablet Take 12.5 mg by mouth every 6 (six) hours as needed.    sertraline (ZOLOFT) 25 MG tablet Take 1 tablet by mouth daily.    Allergies  Allergen Reactions   Ciprocinonide [Fluocinolone] Other (See Comments)    Pass Out   Dye Fdc Red [Red Dye] Other (See Comments) and Nausea And Vomiting    Allergy to Lamictal dye Allergy to Lamictal dye   Other Other (See Comments) and Nausea And Vomiting    Allergy to artificial sweetners Allergy to artificial sweetners   Aspartame Nausea And Vomiting   Ciprofloxacin Nausea And Vomiting      Review of Systems negative except from HPI and PMH  Physical Exam BP (!) 148/108 (Patient Position: Standing)   Pulse 94   Ht 5\' 10"  (1.778 m)   Wt 169 lb 3.2 oz (76.7 kg)   LMP 04/26/2023   BMI 24.28 kg/m  Well developed and nourished in no acute distress HENT normal Neck supple with JVP-  flat  Clear Regular rate and rhythm, no murmurs or gallops Abd-soft with active BS No Clubbing cyanosis edema Skin-warm and dry A & Oriented  Grossly normal sensory and motor function  ECG sinus at 75 Intervals 16/08/40 RSR prime Unchanged  CrCl cannot be calculated (Patient's most recent lab result is older than the maximum 21 days allowed.).    Assessment and Plan:  Neurocardiogenic syncope   Photosensitive epilepsy   Hypertensive  RSR prime   No interval syncope to.  The goal has been  permissive hypertension which we will continue for right now and anticipate down titration of medications, wintertime.  We will plan to refill her prescriptions, she is concerned about the generic variations for her midodrine, we will write give her 2 prescript  Continue exercise        Current medicines are reviewed at length with the patient today .  The patient does not  have concerns regarding medicines.

## 2023-05-16 ENCOUNTER — Encounter: Payer: Self-pay | Admitting: Internal Medicine

## 2023-07-06 ENCOUNTER — Encounter: Payer: Self-pay | Admitting: Internal Medicine

## 2023-07-07 ENCOUNTER — Other Ambulatory Visit: Payer: Self-pay | Admitting: *Deleted

## 2023-07-07 MED ORDER — FLUDROCORTISONE ACETATE 0.1 MG PO TABS: 0.1000 mg | ORAL_TABLET | Freq: Two times a day (BID) | ORAL | 0 refills | Status: DC

## 2023-09-29 ENCOUNTER — Other Ambulatory Visit: Payer: Self-pay | Admitting: Internal Medicine

## 2023-10-12 ENCOUNTER — Encounter: Payer: Self-pay | Admitting: Internal Medicine

## 2023-10-12 ENCOUNTER — Ambulatory Visit: Payer: BLUE CROSS/BLUE SHIELD | Attending: Internal Medicine | Admitting: Internal Medicine

## 2023-10-12 VITALS — BP 154/105 | HR 80 | Ht 70.0 in | Wt 182.0 lb

## 2023-10-12 DIAGNOSIS — G90A Postural orthostatic tachycardia syndrome (POTS): Secondary | ICD-10-CM | POA: Diagnosis not present

## 2023-10-12 MED ORDER — METOPROLOL SUCCINATE ER 50 MG PO TB24: 50.0000 mg | ORAL_TABLET | Freq: Every day | ORAL | 3 refills | Status: DC

## 2023-10-12 MED ORDER — FLUDROCORTISONE ACETATE 0.1 MG PO TABS: 0.1000 mg | ORAL_TABLET | Freq: Every day | ORAL | Status: DC

## 2023-10-12 NOTE — Progress Notes (Signed)
Patient Care Team: Bernadette Hoit, MD as PCP - General (Family Medicine) Van Clines, MD as Consulting Physician (Neurology) Duke Salvia, MD as Consulting Physician (Cardiology)   HPI  Jennifer Galloway is a 32 y.o. female seen in follow-up for unusual spells, not clearly consistent with POTS for which I was concerned about seizures.  In the with referral to [facility] and seen by Dr. Cheshire>> autonomic testing was "consistent with POTS "and participated in the POTS treatment program with significant improvement as per his note 10/23.  Also has what he describes as convulsive seizure.  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  There have been no palpitations, lightheadedness or syncope.   Mental health is much improved on zoloft  Past Medical History:  Diagnosis Date   Attention deficit disorder 12/11/2012   Orthostatic hypotension dysautonomic syndrome    Photosensitive reflex epilepsy (HCC)    POTS (postural orthostatic tachycardia syndrome)    Seizures (HCC)     Past Surgical History:  Procedure Laterality Date   PACEMAKER REMOVAL  05/2019    Current Meds  Medication Sig   Cholecalciferol (VITAMIN D) 50 MCG (2000 UT) tablet Take 2,000 Units by mouth daily.   ferrous sulfate 325 (65 FE) MG tablet Take 325 mg by mouth at bedtime.   fludrocortisone (FLORINEF) 0.1 MG tablet TAKE 1 TABLET(0.1 MG) BY MOUTH TWICE DAILY   lamoTRIgine (LAMICTAL) 100 MG tablet Take 1.5 tablets twice a day   metoprolol succinate (TOPROL-XL) 25 MG 24 hr tablet Take 1 tablet (25 mg total) by mouth daily.   midodrine (PROAMATINE) 10 MG tablet Take 1 tablet (10 mg total) by mouth 3 (three) times  daily with meals.   promethazine (PHENERGAN) 12.5 MG tablet Take 12.5 mg by mouth every 6 (six) hours as needed.     Allergies  Allergen Reactions   Ciprocinonide [Fluocinolone] Other (See Comments)    Pass Out   Dye Fdc Red [Red Dye #40 (Allura Red)] Other (See Comments) and Nausea And Vomiting    Allergy to Lamictal dye Allergy to Lamictal dye   Other Other (See Comments) and Nausea And Vomiting    Allergy to artificial sweetners Allergy to artificial sweetners   Aspartame Nausea And Vomiting   Ciprofloxacin Nausea And Vomiting      Review of Systems negative except from HPI and PMH  Physical Exam BP (!) 154/105 (BP Location: Left Arm, Patient Position: Sitting, Cuff Size: Large)   Pulse 80   Ht 5\' 10"  (1.778 m)   Wt 182 lb (82.6 kg)   SpO2 98%   BMI 26.11 kg/m  Well developed and nourished in no acute distress HENT normal Neck supple with JVP-  flat   Clear Regular rate and rhythm, no murmurs or gallops Abd-soft with active BS No Clubbing cyanosis edema Skin-warm and dry A & Oriented  Grossly normal sensory and motor function    CrCl cannot be calculated (Patient's most recent lab result is older than the maximum 21 days allowed.).    Assessment and Plan:  Neurocardiogenic syncope   Photosensitive epilepsy   Hypertensive  RSR prime   Overall doing exceptionally well.  With her blood pressure being still quite elevated, we will decrease  the fludrocortisone from 0.1 twice daily--daily.  In 2 weeks we will have her increase her metoprolol succinate from 25 daily to 50 taking it at night.  Continue to exercise.  She is wearing compression.  Will see her in about 6 months as the temperature transitions and see if we can down titrate her midodrine a little bit.       Current medicines are reviewed at length with the patient today .  The patient does not  have concerns regarding medicines.

## 2023-10-12 NOTE — Patient Instructions (Addendum)
Medication Instructions:  ** In 2 weeks begin Metoprolol 50mg  - 1 tablet by mouth daily at night.   I sent a new prescription to your pharmacy.  Decrease Florinef 0.1mg  to once daily.  *If you need a refill on your cardiac medications before your next appointment, please call your pharmacy*   Lab Work: None ordered.  If you have labs (blood work) drawn today and your tests are completely normal, you will receive your results only by: MyChart Message (if you have MyChart) OR A paper copy in the mail If you have any lab test that is abnormal or we need to change your treatment, we will call you to review the results.   Testing/Procedures: None ordered.    Follow-Up: At [facility], you and your health needs are our priority.  As part of our continuing mission to provide you with exceptional heart care, we have created designated Provider Care Teams.  These Care Teams include your primary Cardiologist (physician) and Advanced Practice Providers (APPs -  Physician Assistants and Nurse Practitioners) who all work together to provide you with the care you need, when you need it.  We recommend signing up for the patient portal called "MyChart".  Sign up information is provided on this After Visit Summary.  MyChart is used to connect with patients for Virtual Visits (Telemedicine).  Patients are able to view lab/test results, encounter notes, upcoming appointments, etc.  Non-urgent messages can be sent to your provider as well.   To learn more about what you can do with MyChart, go to [email or web address].    Your next appointment:   6 months with Dr Graciela Husbands

## 2023-10-17 ENCOUNTER — Other Ambulatory Visit: Payer: Self-pay | Admitting: Internal Medicine

## 2023-10-22 ENCOUNTER — Other Ambulatory Visit: Payer: Self-pay | Admitting: Internal Medicine

## 2023-10-25 ENCOUNTER — Telehealth: Payer: Self-pay | Admitting: Internal Medicine

## 2023-10-25 NOTE — Telephone Encounter (Signed)
Pt c/o medication issue:  1. Name of Medication: metoprolol succinate (TOPROL-XL) 50 MG 24 hr tablet   2. How are you currently taking this medication (dosage and times per day)? 25mg    3. Are you having a reaction (difficulty breathing--STAT)? No   4. What is your medication issue? Dr Graciela Husbands wanted her to start 50 mg in about two weeks, which would be 12/21. She does not have enough of the 25 mg to last her til then.    [facility] - [address] [place], [place] - [address] Phone: [phone]  Fax: [phone]

## 2023-10-25 NOTE — Telephone Encounter (Signed)
Attempted phone call to pt.  OK per EPIC to leave detailed message.  Pt advised Metoprolol 50mg  - 1 tablet daily at bedtime sent to [place].  Recommended pt contact [facility] and request Rx be transferred to that pharmacy.  Please call or MyChart message if anything further needed.

## 2023-11-07 ENCOUNTER — Encounter: Payer: Self-pay | Admitting: Internal Medicine

## 2024-02-28 ENCOUNTER — Other Ambulatory Visit: Payer: Self-pay | Admitting: Internal Medicine

## 2024-07-06 ENCOUNTER — Encounter: Payer: Self-pay | Admitting: Family Medicine

## 2024-07-06 ENCOUNTER — Ambulatory Visit: Admitting: Family Medicine

## 2024-09-10 ENCOUNTER — Other Ambulatory Visit: Payer: Self-pay | Admitting: Student

## 2024-09-12 MED ORDER — FLUDROCORTISONE ACETATE 0.1 MG PO TABS: 0.1000 mg | ORAL_TABLET | Freq: Every day | ORAL | 0 refills | Status: AC

## 2024-10-09 ENCOUNTER — Other Ambulatory Visit: Payer: Self-pay | Admitting: Student

## 2024-10-10 ENCOUNTER — Other Ambulatory Visit: Payer: Self-pay | Admitting: Student

## 2024-10-15 ENCOUNTER — Other Ambulatory Visit: Payer: Self-pay | Admitting: Student

## 2024-10-15 MED ORDER — METOPROLOL SUCCINATE ER 50 MG PO TB24: 50.0000 mg | ORAL_TABLET | Freq: Every day | ORAL | 0 refills | Status: DC

## 2024-11-06 ENCOUNTER — Ambulatory Visit: Admitting: Family Medicine

## 2024-11-16 ENCOUNTER — Other Ambulatory Visit: Payer: Self-pay | Admitting: Student

## 2024-11-20 NOTE — Progress Notes (Signed)
 refill

## 2024-12-14 ENCOUNTER — Encounter: Payer: Self-pay | Admitting: Family Medicine

## 2024-12-14 ENCOUNTER — Ambulatory Visit: Payer: PRIVATE HEALTH INSURANCE | Admitting: Family Medicine

## 2024-12-14 VITALS — BP 140/100 | HR 81 | Ht 70.0 in | Wt 187.0 lb

## 2024-12-14 DIAGNOSIS — I158 Other secondary hypertension: Secondary | ICD-10-CM

## 2024-12-14 DIAGNOSIS — G90A Postural orthostatic tachycardia syndrome (POTS): Secondary | ICD-10-CM

## 2024-12-14 DIAGNOSIS — F419 Anxiety disorder, unspecified: Secondary | ICD-10-CM

## 2024-12-14 DIAGNOSIS — Z0000 Encounter for general adult medical examination without abnormal findings: Secondary | ICD-10-CM

## 2024-12-14 DIAGNOSIS — Z23 Encounter for immunization: Secondary | ICD-10-CM

## 2024-12-14 LAB — CBC WITH DIFFERENTIAL/PLATELET
Absolute Lymphocytes: 2282 {cells}/uL (ref 850–3900)
Absolute Monocytes: 706 {cells}/uL (ref 200–950)
Basophils Absolute: 50 {cells}/uL (ref 0–200)
Basophils Relative: 0.7 %
Eosinophils Absolute: 79 {cells}/uL (ref 15–500)
Eosinophils Relative: 1.1 %
HCT: 41.6 % (ref 35.9–46.0)
Hemoglobin: 14 g/dL (ref 11.7–15.5)
MCH: 29.5 pg (ref 27.0–33.0)
MCHC: 33.7 g/dL (ref 31.6–35.4)
MCV: 87.6 fL (ref 81.4–101.7)
MPV: 11 fL (ref 7.5–12.5)
Monocytes Relative: 9.8 %
Neutro Abs: 4082 {cells}/uL (ref 1500–7800)
Neutrophils Relative %: 56.7 %
Platelets: 304 10*3/uL (ref 140–400)
RBC: 4.75 Million/uL (ref 3.80–5.10)
RDW: 11.6 % (ref 11.0–15.0)
Total Lymphocyte: 31.7 %
WBC: 7.2 10*3/uL (ref 3.8–10.8)

## 2024-12-14 MED ORDER — SERTRALINE HCL 25 MG PO TABS: 25.0000 mg | ORAL_TABLET | Freq: Every day | ORAL | 3 refills | Status: AC

## 2024-12-14 NOTE — Progress Notes (Signed)
 "  Complete physical exam  Assessment & Plan:    Routine Health Maintenance and Physical Exam Discussed health benefits of physical activity, and encouraged her to engage in regular exercise appropriate for her age and condition.  Preventative health care -     CBC with Differential/Platelet -     Comprehensive metabolic panel with GFR -     Lipid panel -     TSH -     VITAMIN D 25 Hydroxy (Vit-D Deficiency, Fractures) -     T4, free  Immunization due -     Tdap vaccine greater than or equal to 7yo IM  Needs flu shot -     Flu vaccine trivalent PF, 6mos and older(Flulaval,Afluria,Fluarix,Fluzone)  POTS (postural orthostatic tachycardia syndrome) -     CBC with Differential/Platelet -     Comprehensive metabolic panel with GFR  Other secondary hypertension -     CBC with Differential/Platelet -     Comprehensive metabolic panel with GFR  Anxiety -     Sertraline  HCl; Take 1 tablet (25 mg total) by mouth daily.  Dispense: 90 tablet; Refill: 3   Test results were reviewed and analyzed as part of the medical decision making of this visit.  Reviewed previous [facility] from [facility], neurology, and cardiology at [facility].  Follow-up on lab work notify patient.  Continue healthy diet and consistent exercise.  Flu shot and tetanus updated today.  Return in 3 months or sooner if necessary.  Return in about 3 months (around 03/13/2025), or if symptoms worsen or fail to improve.        Subjective:  Patient ID: Jennifer Galloway, female    DOB: [birth year]  Age: 34 y.o. MRN: [id number] Chief Complaint  Patient presents with   Annual Exam    Here for new patient physical. No new concerns.     Jennifer Galloway is a 34 y.o. female who presents today for a complete physical exam. Tetanus-past due- needs today History of Present Illness Jennifer Galloway is a 34 year old female with secondary hypertension who presents for medication  management and vaccinations.  Here for CPE and establish care in our office.   She is currently taking Zoloft  25 mg daily, which effectively manages her anxiety without requiring dosage adjustments.  History of POTS with elevated BP readings- Her hypertension management includes metoprolol  and fludrocortisone  once daily, and midodrine  10 mg three times a day. A previous attempt to reduce her midodrine  dosage led to decreased mental clarity, prompting her to resume her regular schedule. She continues to wear compression garments but removes them after work for comfort. Despite this regimen, her blood pressure remains elevated and this is normal for her.   She has not received a flu shot since 2022. She is reluctant to get the flu shot due to past experiences of feeling sick and achy afterward.  She maintains a regular exercise routine, working with a trainer twice a week, and reports improved flexibility with no dizziness or pain during physical activity. She has a regular sleep schedule and works one weekend a month. Her diet is primarily managed by her mother, who cooks most meals at home.  No dizziness or pain during physical activity, and she reports regular sleep patterns. She does not experience any adverse reactions to blood draws.  Assessment and Plan Orthostatic hypotension/POTS Managed with metoprolol , midodrine , and fludrocortisone . Blood pressure elevated. Midodrine  reduction not tolerated due to cognitive effects. Compression garments  used at work. - Continue metoprolol , midodrine , and fludrocortisone . - Continue wearing compression garments at work. - Follow up with cardiologist on February 11th, 2026.  Major depressive disorder Well-managed on Zoloft  25 mg daily. - Continue Zoloft  25 mg daily. - Changed prescription to 90-day supply.  General Health Maintenance Tetanus vaccination overdue. Last flu shot in 2022. Hesitant about flu shots due to side effects but acknowledges need  for work. - Administered tetanus vaccine. - Administered flu shot. - Ordered CBC, kidney, liver, cholesterol, thyroid, and vitamin D tests. Health Maintenance  Topic Date Due   HIV Screening  Never done   Hepatitis C Screening  Never done   COVID-19 Vaccine (4 - 2025-26 season) 07/09/2024   Pap with HPV screening  10/19/2027   DTaP/Tdap/Td vaccine (8 - Td or Tdap) 12/14/2034   Flu Shot  Completed   HPV Vaccine (No Doses Required) Completed   Pneumococcal Vaccine  Aged Out   Meningitis B Vaccine  Aged Out    Most recent fall risk assessment:    02/14/2020    2:55 PM  Fall Risk   Falls in the past year? 0   Number falls in past yr: 0  Injury with Fall? 0      Data saved with a previous flowsheet row definition     Most recent depression screenings:    12/14/2024   11:02 AM  PHQ 2/9 Scores  PHQ - 2 Score 0      The ASCVD Risk score (Arnett DK, et al., 2019) failed to calculate for the following reasons:   The 2019 ASCVD risk score is only valid for ages 59 to 53   * - Cholesterol units were assumed  Past Surgical History:  Procedure Laterality Date   PACEMAKER REMOVAL  05/2019   WISDOM TOOTH EXTRACTION     Social History[1] Social History   Socioeconomic History   Marital status: Single    Spouse name: Not on file   Number of children: Not on file   Years of education: Not on file   Highest education level: Not on file  Occupational History   Not on file  Tobacco Use   Smoking status: Never    Passive exposure: Never   Smokeless tobacco: Never  Vaping Use   Vaping status: Never Used  Substance and Sexual Activity   Alcohol use: No   Drug use: No   Sexual activity: Never    Birth control/protection: Injection  Other Topics Concern   Not on file  Social History Narrative   Jennifer Galloway graduated last year in December from [employer]. 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    Lives with her parents. She has adult siblings that do  not live in the home.       Social Drivers of Health   Tobacco Use: Low Risk (12/14/2024)   Patient History    Smoking Tobacco Use: Never    Smokeless Tobacco Use: Never    Passive Exposure: Never  Financial Resource Strain: Low Risk  (07/19/2024)   Received [facility]   Overall Financial Resource Strain (CARDIA)    Difficulty of Paying Living Expenses: Not very hard  Food Insecurity: No Food Insecurity (07/19/2024)   Received from [facility]   Epic    Within the past 12 months, you worried that your food would run out before you got the money to buy more.: Never true    Within the  past 12 months, the food you bought just didn't last and you didn't have money to get more.: Never true  Transportation Needs: No Transportation Needs (07/19/2024)   Received from [facility] - Transportation    In the past 12 months, has lack of transportation kept you from medical appointments or from getting medications?: No    Lack of Transportation (Non-Medical): No  Physical Activity: Insufficiently Active (05/30/2022)   Received from [facility]   Exercise Vital Sign    Days of Exercise per Week: 3 days    Minutes of Exercise per Session: 30 min  Stress: Not on file  Social Connections: Not on file  Intimate Partner Violence: Not At Risk (05/30/2022)   Received from [facility]   Humiliation, Afraid, Rape, and Kick questionnaire    Fear of Current or Ex-Partner: No    Emotionally Abused: No    Physically Abused: No    Sexually Abused: No  Depression (PHQ2-9): Low Risk (12/14/2024)   Depression (PHQ2-9)    PHQ-2 Score: 0  Alcohol Screen: Not on file  Housing: Unknown (07/19/2024)   Received from [facility]   Epic    In the last 12 months, was there a time when you were not able to pay the mortgage or rent on time?: No    Number of Times Moved in the Last Year: Not on file    At any time in the past 12  months, were you homeless or living in a shelter (including now)?: No  Utilities: Not At Risk (07/19/2024)   Received from [facility]   Epic    In the past 12 months has the electric, gas, oil, or water company threatened to shut off services in your home?: No  Health Literacy: Not on file   Family History  Problem Relation Age of Onset   Thyroid disease Mother    Seizures Mother    Non-Hodgkin's lymphoma Mother    Prostate cancer Father    Hypertension Father    Cancer Paternal Grandfather        Died at 51   Allergies[2]   Patient Care Team: Aletha Bene, MD as PCP - General (Family Medicine) Georjean Darice HERO, MD as Consulting Physician (Neurology) Fernande Elspeth BROCKS, MD (Inactive) as Consulting Physician (Cardiology)   Show/hide medication list[3]  ROS     Objective:    BP (!) 140/100   Pulse 81   Ht 5' 10 (1.778 m)   Wt 187 lb (84.8 kg)   LMP 11/28/2024 (Approximate)   SpO2 99%   BMI 26.83 kg/m  BP Readings from Last 3 Encounters:  12/14/24 (!) 140/100  10/12/23 (!) 154/105  04/27/23 (!) 148/108   Wt Readings from Last 3 Encounters:  12/14/24 187 lb (84.8 kg)  10/12/23 182 lb (82.6 kg)  04/27/23 169 lb 3.2 oz (76.7 kg)    Physical Exam Vitals and nursing note reviewed.  Constitutional:      General: She is not in acute distress.    Appearance: Normal appearance.  HENT:     Head: Normocephalic.     Right Ear: Tympanic membrane, ear canal and external ear normal.     Left Ear: Tympanic membrane, ear canal and external ear normal.  Eyes:     Extraocular Movements: Extraocular movements intact.     Pupils: Pupils are equal, round, and reactive to light.  Cardiovascular:     Rate and Rhythm: Normal rate  and regular rhythm.     Heart sounds: Normal heart sounds.  Pulmonary:     Effort: Pulmonary effort is normal.     Breath sounds: No wheezing or rhonchi.  Chest:  Breasts:    Right: Normal. No inverted nipple, mass, nipple  discharge, skin change or tenderness.     Left: Normal. No inverted nipple, mass, nipple discharge, skin change or tenderness.  Abdominal:     General: Bowel sounds are normal.     Tenderness: There is no abdominal tenderness. There is no guarding.  Musculoskeletal:     Lumbar back: No tenderness or bony tenderness. Normal range of motion.     Right lower leg: No edema.     Left lower leg: No edema.  Skin:    General: Skin is warm.  Neurological:     General: No focal deficit present.     Mental Status: She is alert and oriented to person, place, and time. Mental status is at baseline.     Cranial Nerves: Cranial nerves 2-12 are intact. No cranial nerve deficit.     Sensory: No sensory deficit.     Motor: Motor function is intact. No weakness.     Coordination: Coordination is intact. Romberg sign negative. Coordination normal. Finger-Nose-Finger Test normal.     Gait: Gait is intact. Gait and tandem walk normal.     Deep Tendon Reflexes: Reflexes are normal and symmetric. Reflexes normal.  Psychiatric:        Mood and Affect: Mood normal.        Behavior: Behavior normal.        Thought Content: Thought content normal.        Judgment: Judgment normal.      No results found for any visits on 12/14/24.      Connie Emperor, MD     [1]  Social History Tobacco Use   Smoking status: Never    Passive exposure: Never   Smokeless tobacco: Never  Vaping Use   Vaping status: Never Used  Substance Use Topics   Alcohol use: No   Drug use: No  [2]  Allergies Allergen Reactions   Ciprocinonide [Fluocinolone] Other (See Comments)    Pass Out   Dye Fdc Red [Red Dye #40 (Allura Red)] Other (See Comments) and Nausea And Vomiting    Allergy to Lamictal  dye Allergy to Lamictal  dye   Other Other (See Comments) and Nausea And Vomiting    Allergy to artificial sweetners Allergy to artificial sweetners   Red Dye #17 (Toney Red) Nausea Only and Other (See Comments)    Allergy to  Lamictal  dye, Allergy to Lamictal  dye, Allergy to Lamictal  dye   Aspartame Nausea And Vomiting   Ciprofloxacin Nausea And Vomiting  [3]  Outpatient Medications Prior to Visit  Medication Sig   Cholecalciferol (VITAMIN D) 50 MCG (2000 UT) tablet Take 2,000 Units by mouth daily.   ferrous sulfate 325 (65 FE) MG tablet Take 325 mg by mouth at bedtime.   fludrocortisone  (FLORINEF ) 0.1 MG tablet Take 1 tablet (0.1 mg total) by mouth daily.   lamoTRIgine  (LAMICTAL ) 100 MG tablet Take 1.5 tablets twice a day   metoprolol  succinate (TOPROL -XL) 50 MG 24 hr tablet TAKE 1 TABLET(50 MG) BY MOUTH AT BEDTIME WITH OR IMMEDIATELY FOLLOWING A MEAL   midodrine  (PROAMATINE ) 10 MG tablet TAKE 1 TABLET BY MOUTH THREE TIMES DAILY WITH MEALS   promethazine (PHENERGAN) 12.5 MG tablet Take 12.5 mg by mouth every 6 (six) hours  as needed.    [DISCONTINUED] sertraline  (ZOLOFT ) 25 MG tablet Take 1 tablet by mouth daily.   No facility-administered medications prior to visit.   "

## 2025-02-11 ENCOUNTER — Ambulatory Visit: Admitting: Family Medicine

## 2025-03-13 ENCOUNTER — Ambulatory Visit: Payer: PRIVATE HEALTH INSURANCE | Admitting: Family Medicine
